# Patient Record
Sex: Female | Born: 1950
Health system: Southern US, Community
[De-identification: ages and names within clinical notes are randomized; demographics above are authoritative.]

## PROBLEM LIST (undated history)

## (undated) DIAGNOSIS — M810 Age-related osteoporosis without current pathological fracture: Secondary | ICD-10-CM

## (undated) DIAGNOSIS — N920 Excessive and frequent menstruation with regular cycle: Secondary | ICD-10-CM

## (undated) DIAGNOSIS — IMO0002 Reserved for concepts with insufficient information to code with codable children: Secondary | ICD-10-CM

## (undated) DIAGNOSIS — R87619 Unspecified abnormal cytological findings in specimens from cervix uteri: Secondary | ICD-10-CM

## (undated) DIAGNOSIS — K219 Gastro-esophageal reflux disease without esophagitis: Secondary | ICD-10-CM

## (undated) DIAGNOSIS — E785 Hyperlipidemia, unspecified: Secondary | ICD-10-CM

## (undated) DIAGNOSIS — N6459 Other signs and symptoms in breast: Secondary | ICD-10-CM

## (undated) DIAGNOSIS — C4491 Basal cell carcinoma of skin, unspecified: Secondary | ICD-10-CM

## (undated) DIAGNOSIS — Z8619 Personal history of other infectious and parasitic diseases: Secondary | ICD-10-CM

## (undated) DIAGNOSIS — I10 Essential (primary) hypertension: Secondary | ICD-10-CM

## (undated) HISTORY — DX: Basal cell carcinoma of skin, unspecified: C44.91

## (undated) HISTORY — DX: Hyperlipidemia, unspecified: E78.5

## (undated) HISTORY — DX: Gastro-esophageal reflux disease without esophagitis: K21.9

## (undated) HISTORY — DX: Excessive and frequent menstruation with regular cycle: N92.0

## (undated) HISTORY — DX: Unspecified abnormal cytological findings in specimens from cervix uteri: R87.619

## (undated) HISTORY — PX: BREAST BIOPSY: SHX20

## (undated) HISTORY — PX: CRYOTHERAPY: SHX1416

## (undated) HISTORY — DX: Reserved for concepts with insufficient information to code with codable children: IMO0002

## (undated) HISTORY — DX: Personal history of other infectious and parasitic diseases: Z86.19

## (undated) HISTORY — PX: DILATION AND CURETTAGE OF UTERUS: SHX78

## (undated) HISTORY — PX: TYMPANOSTOMY TUBE PLACEMENT: SHX32

## (undated) HISTORY — PX: BASAL CELL CARCINOMA EXCISION: SHX1214

---
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
## 1979-03-02 HISTORY — PX: TUBAL LIGATION: SHX77

## 2004-03-01 HISTORY — PX: FOOT SURGERY: SHX648

## 2007-12-05 ENCOUNTER — Other Ambulatory Visit: Admission: RE | Admit: 2007-12-05 | Discharge: 2007-12-05 | Payer: Self-pay | Admitting: Obstetrics and Gynecology

## 2009-03-01 HISTORY — PX: MOLE REMOVAL: SHX2046

## 2010-09-18 ENCOUNTER — Encounter: Payer: Self-pay | Admitting: Infectious Diseases

## 2010-11-09 ENCOUNTER — Telehealth: Payer: Self-pay | Admitting: *Deleted

## 2010-11-09 NOTE — Telephone Encounter (Signed)
error 

## 2011-03-03 NOTE — Progress Notes (Signed)
Patient seen for individual psychotherapy.  Patient upset because husband has no job, is going to two [employer] a day and Youth worker are calling her.  Recommended they engage in daily prayer together, patient get part time job at church and get car through [employer].  Next appointment 03-24-2011.

## 2011-04-09 ENCOUNTER — Other Ambulatory Visit: Payer: Self-pay | Admitting: Physician Assistant

## 2011-04-12 ENCOUNTER — Other Ambulatory Visit: Payer: Self-pay | Admitting: Physician Assistant

## 2011-04-14 ENCOUNTER — Encounter: Payer: Self-pay | Admitting: Psychiatry

## 2011-04-14 NOTE — Progress Notes (Signed)
04-14-2011  Patient seen for individual psychotherapy.  Session focused on strategies to help patient with school work, finances, and marital issues.  Next appointment 04-28-2011.

## 2011-04-28 ENCOUNTER — Encounter: Payer: Self-pay | Admitting: Psychiatry

## 2011-04-28 NOTE — Progress Notes (Signed)
04-28-2011  Patient called to cancel today's appointment.  WCB to reschedule.

## 2011-05-04 NOTE — Progress Notes (Signed)
This encounter was created in error - please disregard.

## 2012-02-15 ENCOUNTER — Other Ambulatory Visit: Payer: Self-pay | Admitting: *Deleted

## 2013-01-12 ENCOUNTER — Encounter: Payer: Self-pay | Admitting: Certified Nurse Midwife

## 2013-01-16 ENCOUNTER — Ambulatory Visit: Payer: Self-pay | Admitting: Certified Nurse Midwife

## 2013-01-16 ENCOUNTER — Encounter: Payer: Self-pay | Admitting: Certified Nurse Midwife

## 2013-01-16 ENCOUNTER — Ambulatory Visit (INDEPENDENT_AMBULATORY_CARE_PROVIDER_SITE_OTHER): Payer: BC Managed Care – PPO | Admitting: Certified Nurse Midwife

## 2013-01-16 VITALS — BP 106/64 | HR 68 | Resp 16 | Ht 62.75 in | Wt 121.0 lb

## 2013-01-16 DIAGNOSIS — Z01419 Encounter for gynecological examination (general) (routine) without abnormal findings: Secondary | ICD-10-CM

## 2013-01-16 DIAGNOSIS — Z0000 Encounter for general adult medical examination without abnormal findings: Secondary | ICD-10-CM

## 2013-01-16 LAB — COMPREHENSIVE METABOLIC PANEL
ALT: 16 U/L (ref 0–35)
AST: 22 U/L (ref 0–37)
Alkaline Phosphatase: 56 U/L (ref 39–117)
CO2: 30 mEq/L (ref 19–32)
Sodium: 142 mEq/L (ref 135–145)
Total Bilirubin: 0.6 mg/dL (ref 0.3–1.2)
Total Protein: 6.9 g/dL (ref 6.0–8.3)

## 2013-01-16 LAB — POCT URINALYSIS DIPSTICK
Bilirubin, UA: NEGATIVE
Blood, UA: NEGATIVE
Glucose, UA: NEGATIVE
Nitrite, UA: NEGATIVE
Urobilinogen, UA: NEGATIVE

## 2013-01-16 LAB — LIPID PANEL
Cholesterol: 232 mg/dL — ABNORMAL HIGH (ref 0–200)
Total CHOL/HDL Ratio: 2.6 Ratio
Triglycerides: 83 mg/dL (ref <150)
VLDL: 17 mg/dL (ref 0–40)

## 2013-01-16 LAB — HEMOGLOBIN, FINGERSTICK: Hemoglobin, fingerstick: 14.8 g/dL (ref 12.0–16.0)

## 2013-01-16 NOTE — Patient Instructions (Signed)

## 2013-01-16 NOTE — Progress Notes (Signed)
62 y.o. G4P1001 Married Caucasian Fe here for annual exam. Menopausal no HRT. Denies vaginal bleeding or dryness. Uses moisturizer as needed without problems. Still working on establish PCP care. Denies health issues today. Had colonoscopy!  Patient's last menstrual period was 03/01/1997.          Sexually active: yes  The current method of family planning is tubal ligation.    Exercising: yes  cardio,strength,walking & yoga Smoker:  no  Health Maintenance: Pap:  01-13-12 neg HPV HR neg MMG:  7/14 Colonoscopy:  3/14 one negative polyp 10 years BMD:   2011 TDaP:  2012 Labs: Poct urine-neg, Hgb-14.8 Self breast exam: done occ   reports that she has never smoked. She does not have any smokeless tobacco history on file. She reports that she drinks about 1.8 ounces of alcohol per week. She reports that she does not use illicit drugs.  Past Medical History  Diagnosis Date  . Abnormal Pap smear     as of 2014 it has been over 20 yrs ago   . Menorrhagia   . Cancer     basal cell of left leg    Past Surgical History  Procedure Laterality Date  . Cryotherapy      for abn pap. as of 2014 it has been over 20 yrs ago since done  . Tubal ligation  1981  . Dilation and curettage of uterus      for menorrhagia  . Foot surgery Left 2006    bone spur  . Basal cell carcinoma excision      left leg  . Tympanostomy tube placement    . Mole removal  2011    rt thigh, negative    Current Outpatient Prescriptions  Medication Sig Dispense Refill  . BIOTIN PO Take by mouth daily.      Marland Kitchen CALCIUM PO Take by mouth daily.      . Cetirizine-Pseudoephedrine (ZYRTEC-D PO) Take by mouth as needed.      . Multiple Vitamins-Minerals (MULTIVITAMIN PO) Take by mouth daily.      . Omega-3 Fatty Acids (OMEGA 3 PO) Take by mouth daily.      Marland Kitchen VITAMIN E PO Take by mouth as needed.        No current facility-administered medications for this visit.    Family History  Problem Relation Age of Onset  .  Hypertension Mother   . Heart disease Father   . Osteoporosis Sister   . Heart disease Sister     pacemaker  . Diabetes Sister   . Diabetes Brother   . Cancer Paternal Grandmother   . Cancer Brother     prostate    ROS:  Pertinent items are noted in HPI.  Otherwise, a comprehensive ROS was negative.  Exam:   BP 106/64  Pulse 68  Resp 16  Ht 5' 2.75" (1.594 m)  Wt 121 lb (54.885 kg)  BMI 21.60 kg/m2  LMP 03/01/1997 Height: 5' 2.75" (159.4 cm)  Ht Readings from Last 3 Encounters:  01/16/13 5' 2.75" (1.594 m)    General appearance: alert, cooperative and appears stated age Head: Normocephalic, without obvious abnormality, atraumatic Neck: no adenopathy, supple, symmetrical, trachea midline and thyroid normal to inspection and palpation Lungs: clear to auscultation bilaterally Breasts: normal appearance, no masses or tenderness, No nipple retraction or dimpling, No nipple discharge or bleeding, No axillary or supraclavicular adenopathy Heart: regular rate and rhythm Abdomen: soft, non-tender; no masses,  no organomegaly Extremities: extremities normal, atraumatic,  no cyanosis or edema Skin: Skin color, texture, turgor normal. No rashes or lesions Lymph nodes: Cervical, supraclavicular, and axillary nodes normal. No abnormal inguinal nodes palpated Neurologic: Grossly normal   Pelvic: External genitalia:  no lesions              Urethra:  normal appearing urethra with no masses, tenderness or lesions              Bartholin's and Skene's: normal                 Vagina: normal appearing vagina with normal color and discharge, no lesions              Cervix: normal, non tender              Pap taken: no Bimanual Exam:  Uterus:  normal size, contour, position, consistency, mobility, non-tender and anteverted              Adnexa: normal adnexa and no mass, fullness, tenderness               Rectovaginal: Confirms               Anus:  normal sphincter tone, no lesions  A:   Well Woman with normal exam  Menopausal no HRT  History of abnormal pap with cryo greater than 20 years ago( patient doesn't know date)  P:   Reviewed health and wellness pertinent to exam  Aware of need to advise if vaginal bleeding  Stressed aex  Fasting labs: CMP,lipid panel  Pap smear as per guidelines   Mammogram yearly pap smear not taken today  counseled on breast self exam, mammography screening, menopause, adequate intake of calcium and vitamin D, diet and exercise, Kegel's exercises, encouraged to work on establishing with PCP  return annually or prn  An After Visit Summary was printed and given to the patient.

## 2013-01-18 NOTE — Progress Notes (Signed)
Note reviewed, agree with plan.  Lakya Schrupp, MD  

## 2013-02-28 ENCOUNTER — Ambulatory Visit: Payer: Self-pay | Admitting: Certified Nurse Midwife

## 2013-12-31 ENCOUNTER — Encounter: Payer: Self-pay | Admitting: Certified Nurse Midwife

## 2014-01-30 ENCOUNTER — Ambulatory Visit (INDEPENDENT_AMBULATORY_CARE_PROVIDER_SITE_OTHER): Payer: No Typology Code available for payment source | Admitting: Certified Nurse Midwife

## 2014-01-30 ENCOUNTER — Encounter: Payer: Self-pay | Admitting: Certified Nurse Midwife

## 2014-01-30 VITALS — BP 110/70 | HR 68 | Resp 16 | Ht 63.0 in | Wt 124.0 lb

## 2014-01-30 DIAGNOSIS — Z01419 Encounter for gynecological examination (general) (routine) without abnormal findings: Secondary | ICD-10-CM

## 2014-01-30 DIAGNOSIS — Z124 Encounter for screening for malignant neoplasm of cervix: Secondary | ICD-10-CM

## 2014-01-30 DIAGNOSIS — Z0000 Encounter for general adult medical examination without abnormal findings: Secondary | ICD-10-CM

## 2014-01-30 LAB — POCT URINALYSIS DIPSTICK
Bilirubin, UA: NEGATIVE
GLUCOSE UA: NEGATIVE
Ketones, UA: NEGATIVE
Leukocytes, UA: NEGATIVE
Nitrite, UA: NEGATIVE
PH UA: 5
Protein, UA: NEGATIVE
RBC UA: NEGATIVE
UROBILINOGEN UA: NEGATIVE

## 2014-01-30 LAB — LIPID PANEL
CHOL/HDL RATIO: 2.4 ratio
Cholesterol: 218 mg/dL — ABNORMAL HIGH (ref 0–200)
HDL: 91 mg/dL (ref >39)
LDL Cholesterol: 116 mg/dL — ABNORMAL HIGH (ref 0–99)
Triglycerides: 55 mg/dL (ref <150)
VLDL: 11 mg/dL (ref 0–40)

## 2014-01-30 LAB — HEMOGLOBIN, FINGERSTICK: Hemoglobin, fingerstick: 15.2 g/dL (ref 12.0–16.0)

## 2014-01-30 NOTE — Patient Instructions (Signed)

## 2014-01-30 NOTE — Progress Notes (Signed)
63 y.o. G44P1001 Married Caucasian Fe here for annual exam. Menopausal no HRT. Denies vaginal bleeding or vaginal dryness.  Plans to establish with PCP in next year. No health issues this past year or today.  Patient's last menstrual period was 03/01/1997.          Sexually active: Yes.    The current method of family planning is tubal ligation.    Exercising: Yes.    walk & cardio Smoker:  no  Health Maintenance: Pap: 01-13-12 neg HPV HR neg MMG:  01-07-14 density category c, birads categrory 1:neg Colonoscopy:  3/14 one neg polyp f/u 5-74yrs BMD:   2011 TDaP:  2012 Labs: Poct urine-neg, Hgb-15.2 Self breast exam: done occ   reports that she has never smoked. She does not have any smokeless tobacco history on file. She reports that she drinks about 1.2 - 1.8 oz of alcohol per week. She reports that she does not use illicit drugs.  Past Medical History  Diagnosis Date  . Abnormal Pap smear     as of 2014 it has been over 20 yrs ago   . Menorrhagia   . Cancer     basal cell of left leg    Past Surgical History  Procedure Laterality Date  . Cryotherapy      for abn pap. as of 2014 it has been over 20 yrs ago since done  . Tubal ligation  1981  . Dilation and curettage of uterus      for menorrhagia  . Foot surgery Left 2006    bone spur  . Basal cell carcinoma excision      left leg  . Tympanostomy tube placement    . Mole removal  2011    rt thigh, negative    Current Outpatient Prescriptions  Medication Sig Dispense Refill  . BIOTIN PO Take by mouth daily.    Marland Kitchen CALCIUM PO Take by mouth daily.    . Cetirizine-Pseudoephedrine (ZYRTEC-D PO) Take by mouth as needed.    . Cholecalciferol (VITAMIN D PO) Take by mouth daily.    . Multiple Vitamins-Minerals (MULTIVITAMIN PO) Take by mouth daily.    . Omega-3 Fatty Acids (OMEGA 3 PO) Take by mouth daily.    Marland Kitchen VITAMIN E PO Take by mouth as needed.      No current facility-administered medications for this visit.    Family  History  Problem Relation Age of Onset  . Hypertension Mother   . Heart disease Father   . Osteoporosis Sister   . Heart disease Sister     pacemaker  . Diabetes Sister   . Diabetes Brother   . Cancer Paternal Grandmother   . Cancer Brother     prostate    ROS:  Pertinent items are noted in HPI.  Otherwise, a comprehensive ROS was negative.  Exam:   BP 110/70 mmHg  Pulse 68  Resp 16  Ht 5\' 3"  (1.6 m)  Wt 124 lb (56.246 kg)  BMI 21.97 kg/m2  LMP 03/01/1997 Height: 5\' 3"  (160 cm)  Ht Readings from Last 3 Encounters:  01/30/14 5\' 3"  (1.6 m)  01/16/13 5' 2.75" (1.594 m)    General appearance: alert, cooperative and appears stated age Head: Normocephalic, without obvious abnormality, atraumatic Neck: no adenopathy, supple, symmetrical, trachea midline and thyroid normal to inspection and palpation Lungs: clear to auscultation bilaterally Breasts: normal appearance, no masses or tenderness, No nipple retraction or dimpling, No nipple discharge or bleeding, No axillary or supraclavicular  adenopathy Heart: regular rate and rhythm Abdomen: soft, non-tender; no masses,  no organomegaly Extremities: extremities normal, atraumatic, no cyanosis or edema Skin: Skin color, texture, turgor normal. No rashes or lesions Lymph nodes: Cervical, supraclavicular, and axillary nodes normal. No abnormal inguinal nodes palpated Neurologic: Grossly normal   Pelvic: External genitalia:  no lesions              Urethra:  normal appearing urethra with no masses, tenderness or lesions              Bartholin's and Skene's: normal                 Vagina: normal appearing vagina with normal color and discharge, no lesions              Cervix: normal, no lesions or tenderness              Pap taken: Yes.   Bimanual Exam:  Uterus:  normal size, contour, position, consistency, mobility, non-tender and anteverted              Adnexa: normal adnexa and no mass, fullness, tenderness                Rectovaginal: Confirms               Anus:  normal sphincter tone, no lesions  A:  Well Woman with normal exam  Menopausal no HRT  History of abnormal pap greater than 20 years ago with cryo  P:   Reviewed health and wellness pertinent to exam  Aware of need to evaluate if vaginal bleeding  Pap smear taken today with reflex  Lab: Lipid panel   counseled on breast self exam, mammography screening, adequate intake of calcium and vitamin D, diet and exercise, Kegel's exercises  return annually or prn  An After Visit Summary was printed and given to the patient.

## 2014-02-04 LAB — IPS PAP TEST WITH REFLEX TO HPV

## 2014-02-06 NOTE — Progress Notes (Signed)
Reviewed personally.  M. Suzanne Parris Signer, MD.  

## 2015-02-10 ENCOUNTER — Ambulatory Visit: Payer: No Typology Code available for payment source | Admitting: Certified Nurse Midwife

## 2015-02-14 ENCOUNTER — Ambulatory Visit: Payer: No Typology Code available for payment source | Admitting: Certified Nurse Midwife

## 2015-04-03 ENCOUNTER — Ambulatory Visit: Payer: No Typology Code available for payment source | Admitting: Certified Nurse Midwife

## 2015-04-04 ENCOUNTER — Encounter: Payer: Self-pay | Admitting: Certified Nurse Midwife

## 2015-04-04 ENCOUNTER — Ambulatory Visit (INDEPENDENT_AMBULATORY_CARE_PROVIDER_SITE_OTHER): Payer: BLUE CROSS/BLUE SHIELD | Admitting: Certified Nurse Midwife

## 2015-04-04 VITALS — BP 120/70 | HR 72 | Resp 16 | Ht 62.75 in | Wt 107.0 lb

## 2015-04-04 DIAGNOSIS — Z01419 Encounter for gynecological examination (general) (routine) without abnormal findings: Secondary | ICD-10-CM

## 2015-04-04 DIAGNOSIS — Z0000 Encounter for general adult medical examination without abnormal findings: Secondary | ICD-10-CM | POA: Diagnosis not present

## 2015-04-04 DIAGNOSIS — Z124 Encounter for screening for malignant neoplasm of cervix: Secondary | ICD-10-CM | POA: Diagnosis not present

## 2015-04-04 LAB — POCT URINALYSIS DIPSTICK
Bilirubin, UA: NEGATIVE
Blood, UA: NEGATIVE
GLUCOSE UA: NEGATIVE
KETONES UA: NEGATIVE
Leukocytes, UA: NEGATIVE
Nitrite, UA: NEGATIVE
Protein, UA: NEGATIVE
Urobilinogen, UA: NEGATIVE
pH, UA: 5

## 2015-04-04 NOTE — Patient Instructions (Signed)
EXERCISE AND DIET:  We recommended that you start or continue a regular exercise program for good health. Regular exercise means any activity that makes your heart beat faster and makes you sweat.  We recommend exercising at least 30 minutes per day at least 3 days a week, preferably 4 or 5.  We also recommend a diet low in fat and sugar.  Inactivity, poor dietary choices and obesity can cause diabetes, heart attack, stroke, and kidney damage, among others.    ALCOHOL AND SMOKING:  Women should limit their alcohol intake to no more than 7 drinks/beers/glasses of wine (combined, not each!) per week. Moderation of alcohol intake to this level decreases your risk of breast cancer and liver damage. And of course, no recreational drugs are part of a healthy lifestyle.  And absolutely no smoking or even second hand smoke. Most people know smoking can cause heart and lung diseases, but did you know it also contributes to weakening of your bones? Aging of your skin?  Yellowing of your teeth and nails?  CALCIUM AND VITAMIN D:  Adequate intake of calcium and Vitamin D are recommended.  The recommendations for exact amounts of these supplements seem to change often, but generally speaking 600 mg of calcium (either carbonate or citrate) and 800 units of Vitamin D per day seems prudent. Certain women may benefit from higher intake of Vitamin D.  If you are among these women, your doctor will have told you during your visit.    PAP SMEARS:  Pap smears, to check for cervical cancer or precancers,  have traditionally been done yearly, although recent scientific advances have shown that most women can have pap smears less often.  However, every woman still should have a physical exam from her gynecologist every year. It will include a breast check, inspection of the vulva and vagina to check for abnormal growths or skin changes, a visual exam of the cervix, and then an exam to evaluate the size and shape of the uterus and  ovaries.  And after 64 years of age, a rectal exam is indicated to check for rectal cancers. We will also provide age appropriate advice regarding health maintenance, like when you should have certain vaccines, screening for sexually transmitted diseases, bone density testing, colonoscopy, mammograms, etc.   MAMMOGRAMS:  All women over 40 years old should have a yearly mammogram. Many facilities now offer a "3D" mammogram, which may cost around $50 extra out of pocket. If possible,  we recommend you accept the option to have the 3D mammogram performed.  It both reduces the number of women who will be called back for extra views which then turn out to be normal, and it is better than the routine mammogram at detecting truly abnormal areas.    COLONOSCOPY:  Colonoscopy to screen for colon cancer is recommended for all women at age 50.  We know, you hate the idea of the prep.  We agree, BUT, having colon cancer and not knowing it is worse!!  Colon cancer so often starts as a polyp that can be seen and removed at colonscopy, which can quite literally save your life!  And if your first colonoscopy is normal and you have no family history of colon cancer, most women don't have to have it again for 10 years.  Once every ten years, you can do something that may end up saving your life, right?  We will be happy to help you get it scheduled when you are ready.    Be sure to check your insurance coverage so you understand how much it will cost.  It may be covered as a preventative service at no cost, but you should check your particular policy.     Atrophic Vaginitis Atrophic vaginitis is when the tissues that line the vagina become dry and thin. This is caused by a drop in estrogen. Estrogen helps:  To keep the vagina moist.  To make a clear fluid that helps:  To lubricate the vagina for sex.  To protect the vagina from infection. If the lining of the vagina is dry and thin, it may:  Make sex painful. It may  also cause bleeding.  Cause a feeling of:  Burning.  Irritation.  Itchiness.  Make an exam of your vagina painful. It may also cause bleeding.  Make you lose interest in sex.  Cause a burning feeling when you pee.  Make your vaginal fluid (discharge) brown or yellow. For some women, there are no symptoms. This condition is most common in women who do not get their regular menstrual periods anymore (menopause). This often starts when a woman is 56-31 years old. HOME CARE  Take medicines only as told by your doctor. Do not use any herbal or alternative medicines unless your doctor says it is okay.  Use over-the-counter products for dryness only as told by your doctor. These include:  Creams.  Lubricants.  Moisturizers.  Do not douche.  Do not use products that can make your vagina dry. These include:  Scented feminine sprays.  Scented tampons.  Scented soaps.  If it hurts to have sex, tell your sexual partner. GET HELP IF:  Your discharge looks different than normal.  Your vagina has an unusual smell.  You have new symptoms.  Your symptoms do not get better with treatment.  Your symptoms get worse.  Coconut oil nightly!!! Debbi   This information is not intended to replace advice given to you by your health care provider. Make sure you discuss any questions you have with your health care provider.   Document Released: 08/04/2007 Document Revised: 07/02/2014 Document Reviewed: 02/06/2014 Elsevier Interactive Patient Education [employer].

## 2015-04-04 NOTE — Progress Notes (Signed)
65 y.o. G45P1001 Married  Caucasian Fe here for annual exam. Menopausal no HRT. Denies vaginal dryness or vaginal bleeding. Has not established PCP yet, but has someone in mine. Uses urgent care if needed. Stressful year with spouse with heart issues and mother in law now living with them. Has lost 17 pounds with the changes she that are occurring. Eating but not as much. Does have good support system with family. Desires screening labs today. No other health issues today.   Patient's last menstrual period was 03/01/1997.          Sexually active: Yes.    The current method of family planning is tubal ligation.    Exercising: Yes.    cardio, strength training & cardio Smoker:  no  Health Maintenance: Pap:  01-30-14 neg, no endos due to atrophy, cryo over 31yrs ago MMG:  01-07-14 category c birads 1:neg  Colonoscopy:  3/14 neg polyp f/u 5-22yrs BMD:   2011 TDaP:  2012 Shingles: no Pneumonia: no Hep C and HIV: not done Labs: poct urine-neg, Hgb-14.6 Self breast exam: done occ Flu shot-done   reports that she has never smoked. She does not have any smokeless tobacco history on file. She reports that she drinks about 1.8 - 2.4 oz of alcohol per week. She reports that she does not use illicit drugs.  Past Medical History  Diagnosis Date  . Abnormal Pap smear     as of 2014 it has been over 20 yrs ago   . Menorrhagia   . Cancer ([place])     basal cell of left leg    Past Surgical History  Procedure Laterality Date  . Cryotherapy      for abn pap. as of 2014 it has been over 20 yrs ago since done  . Tubal ligation  1981  . Dilation and curettage of uterus      for menorrhagia  . Foot surgery Left 2006    bone spur  . Basal cell carcinoma excision      left leg  . Tympanostomy tube placement    . Mole removal  2011    rt thigh, negative    Current Outpatient Prescriptions  Medication Sig Dispense Refill  . BIOTIN PO Take by mouth daily.    Marland Kitchen CALCIUM PO Take by mouth daily.     . Cetirizine-Pseudoephedrine (ZYRTEC-D PO) Take by mouth as needed.    . Cholecalciferol (VITAMIN D PO) Take by mouth daily.    . Multiple Vitamins-Minerals (MULTIVITAMIN PO) Take by mouth daily.    . Omega-3 Fatty Acids (OMEGA 3 PO) Take by mouth daily.    Marland Kitchen VITAMIN E PO Take by mouth as needed.      No current facility-administered medications for this visit.    Family History  Problem Relation Age of Onset  . Hypertension Mother   . Heart disease Father   . Osteoporosis Sister   . Heart disease Sister     pacemaker  . Diabetes Sister   . Diabetes Brother   . Cancer Paternal Grandmother   . Cancer Brother     prostate    ROS:  Pertinent items are noted in HPI.  Otherwise, a comprehensive ROS was negative.  Exam:   BP 120/70 mmHg  Pulse 72  Resp 16  Ht 5' 2.75" (1.594 m)  Wt 107 lb (48.535 kg)  BMI 19.10 kg/m2  LMP 03/01/1997 Height: 5' 2.75" (159.4 cm) Ht Readings from Last 3 Encounters:  04/04/15 5'  2.75" (1.594 m)  01/30/14 5\' 3"  (1.6 m)  01/16/13 5' 2.75" (1.594 m)    General appearance: alert, cooperative and appears stated age Head: Normocephalic, without obvious abnormality, atraumatic Neck: no adenopathy, supple, symmetrical, trachea midline and thyroid normal to inspection and palpation Lungs: clear to auscultation bilaterally Breasts: normal appearance, no masses or tenderness, No nipple retraction or dimpling, No nipple discharge or bleeding, No axillary or supraclavicular adenopathy Heart: regular rate and rhythm Abdomen: soft, non-tender; no masses,  no organomegaly Extremities: extremities normal, atraumatic, no cyanosis or edema Skin: Skin color, texture, turgor normal. No rashes or lesions Lymph nodes: Cervical, supraclavicular, and axillary nodes normal. No abnormal inguinal nodes palpated Neurologic: Grossly normal   Pelvic: External genitalia:  no lesions              Urethra:  normal appearing urethra with no masses, tenderness or lesions               Bartholin's and Skene's: normal                 Vagina: normal appearing vagina with normal color and discharge, no lesions              Cervix: normal, no lesions or tenderness              Pap taken: Yes.   Bimanual Exam:  Uterus:  normal size, contour, position, consistency, mobility, non-tender              Adnexa: normal adnexa and no mass, fullness, tenderness               Rectovaginal: Confirms               Anus:  normal sphincter tone, no lesions  Chaperone present: yes   A:  Well Woman with normal exam  Menopausal no HRT  Atrophic vaginitis  Social stress with weight loss  Mammogram and BMD due  Screening labs  P:   Reviewed health and wellness pertinent to exam  Aware of need to advise if vaginal bleeding  Discussed coconut oil use again for dryness, has used but has been too busy recently. Will start again twice weekly and will advise if problems.  Encouraged to drink one Essure daily to increase calorie load. Patient will try and will advise if no change. Seek friend and family support as needed, in addition to taking time for self.  Stressed importance of mammogram and SBE. Patient declines assistance with our scheduling, but will schedule.  Discussed BMD due and can schedule with mammogram. Plans to do this. Will call if needs referral.  Labs: CMP,Hep C, HIV, Lipid panel,TSH,Vitamin D  Pap smear as above with HPVHR   counseled on breast self exam, mammography screening, HIV risk factors and prevention, adequate intake of calcium and vitamin D, diet and exercise  return annually or prn  An After Visit Summary was printed and given to the patient.

## 2015-04-05 LAB — COMPREHENSIVE METABOLIC PANEL
ALBUMIN: 4.3 g/dL (ref 3.6–5.1)
ALT: 20 U/L (ref 6–29)
AST: 25 U/L (ref 10–35)
Alkaline Phosphatase: 53 U/L (ref 33–130)
BILIRUBIN TOTAL: 0.6 mg/dL (ref 0.2–1.2)
BUN: 15 mg/dL (ref 7–25)
CO2: 22 mmol/L (ref 20–31)
CREATININE: 0.85 mg/dL (ref 0.50–0.99)
Calcium: 9.6 mg/dL (ref 8.6–10.4)
Chloride: 103 mmol/L (ref 98–110)
Glucose, Bld: 89 mg/dL (ref 65–99)
Potassium: 4.1 mmol/L (ref 3.5–5.3)
SODIUM: 144 mmol/L (ref 135–146)
TOTAL PROTEIN: 6.8 g/dL (ref 6.1–8.1)

## 2015-04-05 LAB — LIPID PANEL
Cholesterol: 245 mg/dL — ABNORMAL HIGH (ref 125–200)
HDL: 106 mg/dL (ref >=46)
LDL Cholesterol: 126 mg/dL (ref <130)
TRIGLYCERIDES: 64 mg/dL (ref <150)
Total CHOL/HDL Ratio: 2.3 Ratio (ref <=5.0)
VLDL: 13 mg/dL (ref <30)

## 2015-04-05 LAB — VITAMIN D 25 HYDROXY (VIT D DEFICIENCY, FRACTURES): Vit D, 25-Hydroxy: 41 ng/mL (ref 30–100)

## 2015-04-05 LAB — TSH: TSH: 1.028 u[IU]/mL (ref 0.350–4.500)

## 2015-04-05 LAB — HIV ANTIBODY (ROUTINE TESTING W REFLEX): HIV: NONREACTIVE

## 2015-04-05 LAB — HEPATITIS C ANTIBODY: HCV AB: NEGATIVE

## 2015-04-06 NOTE — Progress Notes (Signed)
Reviewed personally.  M. Suzanne Travonna Swindle, MD.  

## 2015-04-07 LAB — HEMOGLOBIN, FINGERSTICK: HEMOGLOBIN, FINGERSTICK: 14.6 g/dL (ref 12.0–16.0)

## 2015-04-08 ENCOUNTER — Other Ambulatory Visit: Payer: Self-pay | Admitting: Certified Nurse Midwife

## 2015-04-08 DIAGNOSIS — R899 Unspecified abnormal finding in specimens from other organs, systems and tissues: Secondary | ICD-10-CM

## 2015-04-09 LAB — IPS PAP TEST WITH HPV

## 2015-10-06 ENCOUNTER — Other Ambulatory Visit (INDEPENDENT_AMBULATORY_CARE_PROVIDER_SITE_OTHER): Payer: BLUE CROSS/BLUE SHIELD

## 2015-10-06 DIAGNOSIS — R899 Unspecified abnormal finding in specimens from other organs, systems and tissues: Secondary | ICD-10-CM

## 2015-10-06 LAB — LIPID PANEL
CHOLESTEROL: 219 mg/dL — AB (ref 125–200)
HDL: 94 mg/dL (ref >=46)
LDL Cholesterol: 108 mg/dL (ref <130)
TRIGLYCERIDES: 83 mg/dL (ref <150)
Total CHOL/HDL Ratio: 2.3 Ratio (ref <=5.0)
VLDL: 17 mg/dL (ref <30)

## 2016-03-08 DIAGNOSIS — Z1231 Encounter for screening mammogram for malignant neoplasm of breast: Secondary | ICD-10-CM | POA: Diagnosis not present

## 2016-03-11 ENCOUNTER — Encounter: Payer: Self-pay | Admitting: Certified Nurse Midwife

## 2016-04-01 DIAGNOSIS — M9902 Segmental and somatic dysfunction of thoracic region: Secondary | ICD-10-CM | POA: Diagnosis not present

## 2016-04-01 DIAGNOSIS — M9903 Segmental and somatic dysfunction of lumbar region: Secondary | ICD-10-CM | POA: Diagnosis not present

## 2016-04-01 DIAGNOSIS — M9901 Segmental and somatic dysfunction of cervical region: Secondary | ICD-10-CM | POA: Diagnosis not present

## 2016-04-05 ENCOUNTER — Encounter (HOSPITAL_BASED_OUTPATIENT_CLINIC_OR_DEPARTMENT_OTHER): Payer: Self-pay | Admitting: Emergency Medicine

## 2016-04-05 ENCOUNTER — Emergency Department (HOSPITAL_BASED_OUTPATIENT_CLINIC_OR_DEPARTMENT_OTHER)
Admission: EM | Admit: 2016-04-05 | Discharge: 2016-04-05 | Disposition: A | Discharge status: Home / Self Care | Payer: Medicare HMO | Attending: Emergency Medicine | Admitting: Emergency Medicine

## 2016-04-05 ENCOUNTER — Emergency Department (HOSPITAL_BASED_OUTPATIENT_CLINIC_OR_DEPARTMENT_OTHER): Payer: Medicare HMO

## 2016-04-05 DIAGNOSIS — R0789 Other chest pain: Secondary | ICD-10-CM | POA: Diagnosis not present

## 2016-04-05 DIAGNOSIS — R200 Anesthesia of skin: Secondary | ICD-10-CM | POA: Diagnosis not present

## 2016-04-05 DIAGNOSIS — R42 Dizziness and giddiness: Secondary | ICD-10-CM | POA: Diagnosis present

## 2016-04-05 DIAGNOSIS — Z79899 Other long term (current) drug therapy: Secondary | ICD-10-CM | POA: Diagnosis not present

## 2016-04-05 DIAGNOSIS — M79602 Pain in left arm: Secondary | ICD-10-CM | POA: Diagnosis not present

## 2016-04-05 DIAGNOSIS — R079 Chest pain, unspecified: Secondary | ICD-10-CM | POA: Diagnosis not present

## 2016-04-05 LAB — CBC WITH DIFFERENTIAL/PLATELET
Basophils Absolute: 0 10*3/uL (ref 0.0–0.1)
Basophils Relative: 0 %
Eosinophils Absolute: 0.1 10*3/uL (ref 0.0–0.7)
Eosinophils Relative: 1 %
HCT: 44.3 % (ref 36.0–46.0)
Hemoglobin: 15 g/dL (ref 12.0–15.0)
Lymphocytes Relative: 17 %
Lymphs Abs: 0.9 10*3/uL (ref 0.7–4.0)
MCH: 31 pg (ref 26.0–34.0)
MCHC: 33.9 g/dL (ref 30.0–36.0)
MCV: 91.5 fL (ref 78.0–100.0)
Monocytes Absolute: 0.4 10*3/uL (ref 0.1–1.0)
Monocytes Relative: 8 %
Neutro Abs: 3.6 10*3/uL (ref 1.7–7.7)
Neutrophils Relative %: 74 %
Platelets: 195 10*3/uL (ref 150–400)
RBC: 4.84 MIL/uL (ref 3.87–5.11)
RDW: 13.5 % (ref 11.5–15.5)
WBC: 5 10*3/uL (ref 4.0–10.5)

## 2016-04-05 LAB — COMPREHENSIVE METABOLIC PANEL
ALT: 23 U/L (ref 14–54)
ANION GAP: 8 (ref 5–15)
AST: 26 U/L (ref 15–41)
Albumin: 4.6 g/dL (ref 3.5–5.0)
Alkaline Phosphatase: 57 U/L (ref 38–126)
BILIRUBIN TOTAL: 0.7 mg/dL (ref 0.3–1.2)
BUN: 17 mg/dL (ref 6–20)
CO2: 26 mmol/L (ref 22–32)
Calcium: 9.2 mg/dL (ref 8.9–10.3)
Chloride: 108 mmol/L (ref 101–111)
Creatinine, Ser: 0.9 mg/dL (ref 0.44–1.00)
Glucose, Bld: 102 mg/dL — ABNORMAL HIGH (ref 65–99)
POTASSIUM: 3.7 mmol/L (ref 3.5–5.1)
Sodium: 142 mmol/L (ref 135–145)
Total Protein: 7.1 g/dL (ref 6.5–8.1)

## 2016-04-05 LAB — TROPONIN I
Troponin I: <0.03 ng/mL (ref <0.03)
Troponin I: <0.03 ng/mL (ref <0.03)

## 2016-04-05 NOTE — ED Notes (Signed)
ED Provider at bedside. 

## 2016-04-05 NOTE — ED Triage Notes (Addendum)
Pt c/o LUE numbess, intermittently since 2am; similar episode approx 1 wk ago; also c/o "head doesn't feel right" today; also c/o LT chest discomfort since approx 2am as well

## 2016-04-05 NOTE — ED Provider Notes (Signed)
[facility] DEPT Provider Note   CSN: [id number] Arrival date & time: 04/05/16 [id number]     History    Chief Complaint  Patient presents with  . Dizziness     HPI Jacqueline Ray is a 66 y.o. female.  65yo F who p/w L arm numbness, heart fluttering and chest discomfort. Patient was awakened at 2 AM this morning with sudden onset of left arm numbness associated with heart racing sensation and head fluttering sensation. She got up and did some things around the house and then laid back down and symptoms resolved and she eventually fell asleep again. She woke back up at 4:30 AM with the same symptoms as well as mild, dull left chest discomfort. His discomfort has been almost constant since it began. She has continued to have the intermittent episodes of left arm numbness with heart racing sensation that last a few minutes and then resolved for at least 30 minutes before reoccurring again. She reports associated occasional lightheadedness but denies any shortness of breath, nausea, vomiting, or diaphoresis. She denies any headache, extremity weakness, visual changes, fevers, cough/cold symptoms, or recent illness. She states that she did have a similar episode of left arm numbness and heart fluttering last week that resolved spontaneously. She denies any association with exertion, movement, or positioning.  Daily history notable for father with MI in his 37s, brother with MI in his 46s, and sister with MI in her 53s.   Past Medical History:  Diagnosis Date  . Abnormal Pap smear    as of 2014 it has been over 20 yrs ago   . Cancer ([place])    basal cell of left leg  . Menorrhagia      There are no active problems to display for this patient.   Past Surgical History:  Procedure Laterality Date  . BASAL CELL CARCINOMA EXCISION     left leg  . CRYOTHERAPY     for abn pap. as of 2014 it has been over 20 yrs ago since done  . DILATION AND CURETTAGE OF UTERUS     for menorrhagia  . FOOT  SURGERY Left 2006   bone spur  . MOLE REMOVAL  2011   rt thigh, negative  . TUBAL LIGATION  1981  . TYMPANOSTOMY TUBE PLACEMENT      OB History    Gravida Para Term Preterm AB Living   1 1 1     1    SAB TAB Ectopic Multiple Live Births           1        Home Medications    Prior to Admission medications   Medication Sig Start Date End Date Taking? Authorizing Provider  BIOTIN PO Take by mouth daily.    Historical Provider, MD  CALCIUM PO Take by mouth daily.    Historical Provider, MD  Cetirizine-Pseudoephedrine (ZYRTEC-D PO) Take by mouth as needed.    Historical Provider, MD  Cholecalciferol (VITAMIN D PO) Take by mouth daily.    Historical Provider, MD  Multiple Vitamins-Minerals (MULTIVITAMIN PO) Take by mouth daily.    Historical Provider, MD  Omega-3 Fatty Acids (OMEGA 3 PO) Take by mouth daily.    Historical Provider, MD  VITAMIN E PO Take by mouth as needed.     Historical Provider, MD      Family History  Problem Relation Age of Onset  . Osteoporosis Sister   . Heart disease Sister     pacemaker  . Diabetes  Sister   . Diabetes Brother   . Cancer Brother     prostate  . Hypertension Mother   . Heart disease Father   . Cancer Paternal Grandmother      Social History  Substance Use Topics  . Smoking status: Never Smoker  . Smokeless tobacco: Never Used  . Alcohol use 1.8 - 2.4 oz/week    3 - 4 Glasses of wine per week     Comment: per week     Allergies     Codeine and Darvocet [propoxyphene n-acetaminophen]    Review of Systems  10 Systems reviewed and are negative for acute change except as noted in the HPI.   Physical Exam Updated Vital Signs BP 139/68   Pulse 72   Temp 98.4 F (36.9 C) (Oral)   Resp 17   Ht 5' 3.5" (1.613 m)   Wt 115 lb (52.2 kg)   LMP 03/01/1997   SpO2 100%   BMI 20.05 kg/m   Physical Exam  Constitutional: She is oriented to person, place, and time. She appears well-developed and well-nourished. No  distress.  Awake, alert  HENT:  Head: Normocephalic and atraumatic.  Eyes: Conjunctivae and EOM are normal. Pupils are equal, round, and reactive to light.  Neck: Neck supple.  Cardiovascular: Normal rate, regular rhythm and normal heart sounds.   No murmur heard. Pulmonary/Chest: Effort normal and breath sounds normal. No respiratory distress.  Abdominal: Soft. Bowel sounds are normal. She exhibits no distension. There is no tenderness.  Musculoskeletal: She exhibits no edema.  Neurological: She is alert and oriented to person, place, and time. She has normal reflexes. No cranial nerve deficit. She exhibits normal muscle tone.  Fluent speech, normal finger-to-nose testing, negative pronator drift, no clonus 5/5 strength and normal sensation x all 4 extremities  Skin: Skin is warm and dry.  Psychiatric: She has a normal mood and affect. Judgment and thought content normal.  Nursing note and vitals reviewed.     ED Treatments / Results  Labs (all labs ordered are listed, but only abnormal results are displayed) Labs Reviewed  COMPREHENSIVE METABOLIC PANEL - Abnormal; Notable for the following:       Result Value   Glucose, Bld 102 (*)    All other components within normal limits  CBC WITH DIFFERENTIAL/PLATELET  TROPONIN I  TROPONIN I     EKG  EKG Interpretation  Date/Time:  Monday April 05 2016 09:15:10 EST Ventricular Rate:  84 PR Interval:    QRS Duration: 103 QT Interval:  357 QTC Calculation: 422 R Axis:   44 Text Interpretation:  Sinus rhythm Biatrial enlargement RSR' in V1 or V2, right VCD or RVH No previous ECGs available Confirmed by LITTLE MD, RACHEL [phone]) on 04/05/2016 9:18:31 AM Also confirmed by LITTLE MD, [place] [phone]), editor [place]  CCT, BEVERLY ([id number])  on 04/05/2016 11:25:24 AM         Radiology Dg Chest 2 View  Result Date: 04/05/2016 CLINICAL DATA:  Left-sided chest pain radiating to the arm beginning several hours ago. EXAM: CHEST  2 VIEW  COMPARISON:  None. FINDINGS: Heart size is normal. Mediastinal shadows are normal. The lungs are clear. No bronchial thickening. No infiltrate, mass, effusion or collapse. Pulmonary vascularity is normal. No bony abnormality. IMPRESSION: No active cardiopulmonary disease. Electronically Signed   By: Nelson Chimes M.D.   On: 04/05/2016 11:01    Procedures Procedures (including critical care time) Procedures  Medications Ordered in ED  Medications - No  data to display   Initial Impression / Assessment and Plan / ED Course  I have reviewed the triage vital signs and the nursing notes.  Pertinent labs & imaging results that were available during my care of the patient were reviewed by me and considered in my medical decision making (see chart for details).    Pt w/ intermittent episodes of left arm numbness associated with heart fluttering and had fluttering, no associated weakness or confusion. She has had intermittent mild left chest discomfort but no severe chest pain and no shortness of breath. She was well-appearing on exam, vital signs notable for hypertension at 172/85. Heart rate during my examination was normal. She had a normal neurologic exam. EKG without acute ischemia. Obtained above lab work including troponin.  I consider neurologic process such as TIA or stroke but the patient has a normal neurologic exam and given report of heart flutter and discomfort related to it I feel neurologic process is extremely unlikely.  Serial troponins negative. No risk factors for PE and given no SOB and normal VS I feel PE is extremely unlikely. Given that patient has no medical problems, her HEART score is 3 based on age and Family history of heart disease. I have discussed results with her and discussed options of going home with follow-up versus being admitted to the hospital for chest pain rule out. The patient feels comfortable with discharge and will contact PCP for outpatient appointment.  Extensively reviewed return precautions with her including any worsening symptoms, severe chest pain, or exertional symptoms. She voiced understanding and was discharged in satisfactory condition.  Final Clinical Impressions(s) / ED Diagnoses   Final diagnoses:  Left arm numbness  Chest discomfort     New Prescriptions   No medications on file       Sharlett Iles, MD 04/05/16 [phone]

## 2016-04-05 NOTE — Discharge Instructions (Signed)
PLEASE CONTACT PRIMARY CARE PROVIDER FOR NEXT AVAILABLE APPOINTMENT. YOU MAY NEED STRESS TEST OF YOUR HEART. RETURN IMMEDIATELY IF ANY OF YOUR SYMPTOMS WORSEN.

## 2016-04-07 ENCOUNTER — Ambulatory Visit: Payer: BLUE CROSS/BLUE SHIELD | Admitting: Certified Nurse Midwife

## 2016-04-08 ENCOUNTER — Ambulatory Visit (INDEPENDENT_AMBULATORY_CARE_PROVIDER_SITE_OTHER): Payer: Medicare HMO | Admitting: Medical

## 2016-04-08 ENCOUNTER — Encounter: Payer: Self-pay | Admitting: Medical

## 2016-04-08 VITALS — BP 140/81 | HR 82 | Temp 98.0°F | Ht 62.75 in | Wt 118.0 lb

## 2016-04-08 DIAGNOSIS — R0789 Other chest pain: Secondary | ICD-10-CM

## 2016-04-08 DIAGNOSIS — H938X1 Other specified disorders of right ear: Secondary | ICD-10-CM | POA: Diagnosis not present

## 2016-04-08 DIAGNOSIS — R030 Elevated blood-pressure reading, without diagnosis of hypertension: Secondary | ICD-10-CM

## 2016-04-08 MED ORDER — FLUTICASONE PROPIONATE 50 MCG/ACT NA SUSP: 2.0000 | Freq: Every day | NASAL | 3 refills | Status: DC

## 2016-04-08 NOTE — Progress Notes (Signed)
Subjective:    Patient ID: Jacqueline Ray, female    DOB: Jul 03, 1950, 66 y.o.   MRN: [id number]  HPI   I have reviewed pt PMH, PSH, FH, Social History and Surgical History  Pt works Surveyor, quantity, exercise regularly at least 3-4 times a week, Eat moderate healthy, married- 1 son.  Pt in states she went to ED other night for symptoms of left arm numbness and fluttering sensation in her chest(those symptoms lasted for about 12 hrs and one other episode about 1 week before went to ED). Pt had work up of serial troponins and were negative. Pt had negative ekg in ED. Pt was educated/offered possible admission to rule out cardiac condition but she declined. Pt states by Tuesday symptoms resolved and did not return. Dad MI at age 78 yo. 1 brother MI- pt was 26 yo. Pt does have high total cholesterol 1 yr ago in epic. Pt does not smoke. Pt has no hx of htn.  Pt has some rt side ear pressure and faint nasal congestion. Hx of ear tube placed in 2009 after not able to hear.(Done by Dr. Thornell Mule). Pt has off and on nasal congestion since the fall.    Review of Systems  Constitutional: Negative for chills.  HENT: Positive for congestion and ear pain. Negative for postnasal drip, rhinorrhea, sinus pain, sinus pressure and tinnitus.   Respiratory: Negative for cough, chest tightness, shortness of breath and wheezing.   Cardiovascular: Negative for chest pain and palpitations.       None presently.  Gastrointestinal: Negative for abdominal pain.  Musculoskeletal: Negative for back pain, gait problem and neck pain.  Skin: Negative for rash.  Neurological: Negative for dizziness, tremors, facial asymmetry and numbness.  Hematological: Negative for adenopathy. Does not bruise/bleed easily.  Psychiatric/Behavioral: Negative for behavioral problems, confusion and suicidal ideas. The patient is not nervous/anxious.     Past Medical History:  Diagnosis Date  . Abnormal Pap smear    as of 2014 it has been  over 20 yrs ago   . Hyperlipidemia   . Menorrhagia      Social History   Social History  . Marital status: Married    Spouse name: N/A  . Number of children: N/A  . Years of education: N/A   Occupational History  . Not on file.   Social History Main Topics  . Smoking status: Never Smoker  . Smokeless tobacco: Never Used  . Alcohol use 1.8 - 2.4 oz/week    3 - 4 Glasses of wine per week     Comment: per week  . Drug use: No  . Sexual activity: Yes    Partners: Male    Birth control/ protection: Surgical     Comment: BTL   Other Topics Concern  . Not on file   Social History Narrative  . No narrative on file    Past Surgical History:  Procedure Laterality Date  . BASAL CELL CARCINOMA EXCISION    . CRYOTHERAPY     for abn pap. as of 2014 it has been over 20 yrs ago since done  . DILATION AND CURETTAGE OF UTERUS     for menorrhagia  . FOOT SURGERY Left 2006   bone spur  . MOLE REMOVAL  2011   rt thigh, negative  . TUBAL LIGATION  1981  . TYMPANOSTOMY TUBE PLACEMENT      Family History  Problem Relation Age of Onset  . Osteoporosis Sister   . Heart  disease Sister     pacemaker  . Diabetes Sister   . Diabetes Brother   . Cancer Brother     prostate  . Hypertension Mother   . Heart disease Father   . Cancer Paternal Grandmother     Allergies  Allergen Reactions  . Codeine   . Darvocet [Propoxyphene N-Acetaminophen] Nausea And Vomiting    Current Outpatient Prescriptions on File Prior to Visit  Medication Sig Dispense Refill  . BIOTIN PO Take by mouth daily.    Marland Kitchen CALCIUM PO Take by mouth daily.    . Cholecalciferol (VITAMIN D PO) Take by mouth daily.    . Multiple Vitamins-Minerals (MULTIVITAMIN PO) Take by mouth daily.    . Omega-3 Fatty Acids (OMEGA 3 PO) Take by mouth daily.    Marland Kitchen VITAMIN E PO Take by mouth as needed.     . Cetirizine-Pseudoephedrine (ZYRTEC-D PO) Take by mouth as needed.     No current facility-administered medications on  file prior to visit.     BP 140/81   Pulse 82   Temp 98 F (36.7 C) (Oral)   Ht 5' 2.75" (1.594 m)   Wt 118 lb (53.5 kg)   LMP 03/01/1997   SpO2 98%   BMI 21.07 kg/m       Objective:   Physical Exam  General Mental Status- Alert. General Appearance- Not in acute distress.   Heent- negative but rt ear canal tube in place with some faint wax at base of tm adjacent to ear tube. Tm normal color. Also mild boggy turbinates.  Skin General: Color- Normal Color. Moisture- Normal Moisture.  Neck Carotid Arteries- Normal color. Moisture- Normal Moisture. No carotid bruits. No JVD.  Chest and Lung Exam Auscultation: Breath Sounds:-Normal.  Cardiovascular Auscultation:Rythm- Regular. Murmurs & Other Heart Sounds:Auscultation of the heart reveals- No Murmurs.  Abdomen Inspection:-Inspeection Normal. Palpation/Percussion:Note:No mass. Palpation and Percussion of the abdomen reveal- Non Tender, Non Distended + BS, no rebound or guarding.  Neurologic Cranial Nerve exam:- CN III-XII intact(No nystagmus), symmetric smile. Strength:- 5/5 equal and symmetric strength both upper and lower extremities.      Assessment & Plan:  For your recent atypical chest pain and moderate risk factors refer to cardiologist for  Stress test and possible holter monitor.  Future lipid panel and cmp to be done next week.  Avoid caffeine.  If recurrent chest pain, flutters or associated symptoms then ED evaluation.  May consider low dose statin in future. (future labs to be done in one week fasting) Check bp daily with husband bp cuff and record. [id number] would be acceptable. Notify us in one week on readings.  For ear pressure rx flonase. If with this and pressure persists then ENT referral.  Follow up in 3-4 weeks or as needed  Almee Pelphrey, Percell Miller, [employer]

## 2016-04-08 NOTE — Progress Notes (Signed)
Pre visit review using our clinic tool,if applicable. No additional management support is needed unless otherwise documented below in the visit note.  

## 2016-04-08 NOTE — Patient Instructions (Addendum)
For your recent atypical chest pain and moderate risk factors refer to cardiologist for stress test and possible holter monitor.  Future lipid panel and cmp to be done next week.  Avoid caffeine.  If recurrent chest pain, flutters or associated symptoms then ED evaluation.  May consider low dose statin in future. (future labs to be done in one week fasting) Check bp daily with husband bp cuff and record. [id number] would be acceptable. Notify us in one week on readings.  For ear pressure rx flonase. If with this and pressure persists then ENT referral.  Follow up in 3-4 weeks or as needed

## 2016-04-15 ENCOUNTER — Other Ambulatory Visit (INDEPENDENT_AMBULATORY_CARE_PROVIDER_SITE_OTHER): Payer: Medicare HMO

## 2016-04-15 DIAGNOSIS — R0789 Other chest pain: Secondary | ICD-10-CM | POA: Diagnosis not present

## 2016-04-15 DIAGNOSIS — R030 Elevated blood-pressure reading, without diagnosis of hypertension: Secondary | ICD-10-CM | POA: Diagnosis not present

## 2016-04-15 LAB — COMPREHENSIVE METABOLIC PANEL
ALBUMIN: 4.4 g/dL (ref 3.5–5.2)
ALK PHOS: 57 U/L (ref 39–117)
ALT: 20 U/L (ref 0–35)
AST: 24 U/L (ref 0–37)
BILIRUBIN TOTAL: 0.5 mg/dL (ref 0.2–1.2)
BUN: 17 mg/dL (ref 6–23)
CALCIUM: 9.3 mg/dL (ref 8.4–10.5)
CO2: 32 mEq/L (ref 19–32)
CREATININE: 0.9 mg/dL (ref 0.40–1.20)
Chloride: 104 mEq/L (ref 96–112)
GFR: 66.76 mL/min (ref >60.00)
Glucose, Bld: 91 mg/dL (ref 70–99)
Potassium: 3.7 mEq/L (ref 3.5–5.1)
Sodium: 142 mEq/L (ref 135–145)
TOTAL PROTEIN: 6.9 g/dL (ref 6.0–8.3)

## 2016-04-15 LAB — LIPID PANEL
CHOL/HDL RATIO: 3
CHOLESTEROL: 246 mg/dL — AB (ref 0–200)
HDL: 95.7 mg/dL (ref >39.00)
LDL Cholesterol: 136 mg/dL — ABNORMAL HIGH (ref 0–99)
NonHDL: 150.63
TRIGLYCERIDES: 74 mg/dL (ref 0.0–149.0)
VLDL: 14.8 mg/dL (ref 0.0–40.0)

## 2016-04-16 ENCOUNTER — Telehealth: Payer: Self-pay | Admitting: Medical

## 2016-04-16 NOTE — Telephone Encounter (Signed)
Called pt, lab results and provider recommendation discussed.

## 2016-04-16 NOTE — Telephone Encounter (Signed)
Patient called requesting a call back regarding her lab results. Please advise  Phone: [phone]

## 2016-04-21 ENCOUNTER — Encounter: Payer: Self-pay | Admitting: Cardiology

## 2016-04-21 ENCOUNTER — Ambulatory Visit (INDEPENDENT_AMBULATORY_CARE_PROVIDER_SITE_OTHER): Payer: Medicare HMO | Admitting: Cardiology

## 2016-04-21 VITALS — BP 144/78 | HR 80 | Ht 62.75 in | Wt 117.4 lb

## 2016-04-21 DIAGNOSIS — E7800 Pure hypercholesterolemia, unspecified: Secondary | ICD-10-CM

## 2016-04-21 DIAGNOSIS — R002 Palpitations: Secondary | ICD-10-CM

## 2016-04-21 DIAGNOSIS — E785 Hyperlipidemia, unspecified: Secondary | ICD-10-CM | POA: Insufficient documentation

## 2016-04-21 DIAGNOSIS — R079 Chest pain, unspecified: Secondary | ICD-10-CM

## 2016-04-21 NOTE — Progress Notes (Signed)
Cardiology Office Note    Date:  04/21/2016   ID:  Jacqueline Ray, DOB 06-04-1950, MRN [id number]  PCP:  Mackie Pai, PA-C  Cardiologist:  Fransico Him, MD   Chief Complaint  Patient presents with  . Chest Pain  . Hyperlipidemia  . Irregular Heart Beat    History of Present Illness:  Jacqueline Ray is a 66 y.o. female with a history of hyperlipidemia who is referred for evaluation of palpitations and dizziness, She apparently awaked at 2am and noted sudden onset of left arm numbness associated with heart fluttering.  She got up and walked around the house and her symptoms resolved and she went back to sleep.  She awakened again at 4:30am with same symptoms and left sided dull chest discomfort.  She denied any SOB, N/Vomiting or diaphoresis.  She was hypertensive on admission.  She went to the ER and was evaluated and workup with troponin and EKG was normal.  She is now referred here for further evaluation.  She says that she had had a similar feeling a few weeks prior that was short lived.  She says that her chest discomfort felt like gas and was associated with belching which improved her chest discomfort.  She has not had any further chest discomfort since then.  She has noticed some DOE but she exercises regularly and has not interfered with her workout.  She still occasionally has some fluttering in her chest.  She gets some lightheadedness with the palpitations.  She denies any history of syncope.  She denies any LE edema, claudication, PND or orthopnea.   She has a strong family history of CAD in her Dad at 66, brother at 59 and sister at 24 (she was a smoker and diabetic).  She has never smoked.    Past Medical History:  Diagnosis Date  . Abnormal Pap smear    as of 2014 it has been over 20 yrs ago   . Hyperlipidemia   . Menorrhagia     Past Surgical History:  Procedure Laterality Date  . BASAL CELL CARCINOMA EXCISION    . CRYOTHERAPY     for abn pap. as of 2014 it has been  over 20 yrs ago since done  . DILATION AND CURETTAGE OF UTERUS     for menorrhagia  . FOOT SURGERY Left 2006   bone spur  . MOLE REMOVAL  2011   rt thigh, negative  . TUBAL LIGATION  1981  . TYMPANOSTOMY TUBE PLACEMENT      Current Medications: Current Meds  Medication Sig  . BIOTIN PO Take by mouth daily.  Marland Kitchen CALCIUM PO Take by mouth daily.  . Cholecalciferol (VITAMIN D PO) Take by mouth daily.  . fluticasone (FLONASE) 50 MCG/ACT nasal spray Place 2 sprays into both nostrils daily.  . Multiple Vitamins-Minerals (MULTIVITAMIN PO) Take by mouth daily.  . Omega-3 Fatty Acids (OMEGA 3 PO) Take by mouth daily.  Marland Kitchen VITAMIN E PO Take by mouth as needed.     Allergies:   Codeine and Darvocet [propoxyphene n-acetaminophen]   Social History   Social History  . Marital status: Married    Spouse name: N/A  . Number of children: N/A  . Years of education: N/A   Social History Main Topics  . Smoking status: Never Smoker  . Smokeless tobacco: Never Used  . Alcohol use 1.8 - 2.4 oz/week    3 - 4 Glasses of wine per week     Comment: per week  .  Drug use: No  . Sexual activity: Yes    Partners: Male    Birth control/ protection: Surgical     Comment: BTL   Other Topics Concern  . None   Social History Narrative  . None     Family History:  The patient's family history includes Cancer in her brother and paternal grandmother; Diabetes in her brother and sister; Heart disease in her father and sister; Hypertension in her mother; Osteoporosis in her sister.   ROS:   Please see the history of present illness.    ROS All other systems reviewed and are negative.  No flowsheet data found.     PHYSICAL EXAM:   VS:  BP (!) 144/78   Pulse 80   Ht 5' 2.75" (1.594 m)   Wt 117 lb 6.4 oz (53.3 kg)   LMP 03/01/1997   SpO2 99%   BMI 20.96 kg/m    GEN: Well nourished, well developed, in no acute distress  HEENT: normal  Neck: no JVD, carotid bruits, or masses Cardiac: RRR; no  murmurs, rubs, or gallops,no edema.  Intact distal pulses bilaterally.  Respiratory:  clear to auscultation bilaterally, normal work of breathing GI: soft, nontender, nondistended, + BS MS: no deformity or atrophy  Skin: warm and dry, no rash Neuro:  Alert and Oriented x 3, Strength and sensation are intact Psych: euthymic mood, full affect  Wt Readings from Last 3 Encounters:  04/21/16 117 lb 6.4 oz (53.3 kg)  04/08/16 118 lb (53.5 kg)  04/05/16 115 lb (52.2 kg)      Studies/Labs Reviewed:   EKG:  EKG is not ordered today. In ER EKG showed NSR with rSR" in V1 otherwise normal  Recent Labs: 04/05/2016: Hemoglobin 15.0; Platelets 195 04/15/2016: ALT 20; BUN 17; Creatinine, Ser 0.90; Potassium 3.7; Sodium 142   Lipid Panel    Component Value Date/Time   CHOL 246 (H) 04/15/2016 0759   TRIG 74.0 04/15/2016 0759   HDL 95.70 04/15/2016 0759   CHOLHDL 3 04/15/2016 0759   VLDL 14.8 04/15/2016 0759   LDLCALC 136 (H) 04/15/2016 0759    Additional studies/ records that were reviewed today include:  Notes from ER visit    ASSESSMENT:    1. Chest pain, unspecified type   2. Heart palpitations   3. Pure hypercholesterolemia      PLAN:  In order of problems listed above:  1. Chest pain that is somewhat atypical in that it is improved with belching but was associated with left arm numbness.  She has a strong family history of CAD and MI and she has hyperlipidemia and has not been on meds.  She is reluctant to go on lipid lowering therapy. I will set her up for a stress myoview to rule out ischemia and 2D echo to assess LVF. 2. Palpitations - I will get her set up for an event monitor to rule out PAF. 3. Hyperlipidemia - Her last LDL was 136 and HDL was 95.  She is reluctant to go on lipid lowering therapy and I have told her that since her HDL is so high she can continue diet for now unless her workup shows vascular disease.    Medication Adjustments/Labs and Tests  Ordered: Current medicines are reviewed at length with the patient today.  Concerns regarding medicines are outlined above.  Medication changes, Labs and Tests ordered today are listed in the Patient Instructions below.  There are no Patient Instructions on file for this visit.  Signed, Fransico Him, MD  04/21/2016 9:33 AM    [facility] [address] [address], [place], [place]  [id number] Phone: [phone]; Fax: [phone]

## 2016-04-21 NOTE — Patient Instructions (Signed)
Medication Instructions:  Your physician recommends that you continue on your current medications as directed. Please refer to the Current Medication list given to you today.   Labwork: None  Testing/Procedures: Your physician has requested that you have an echocardiogram. Echocardiography is a painless test that uses sound waves to create images of your heart. It provides your doctor with information about the size and shape of your heart and how well your heart's chambers and valves are working. This procedure takes approximately one hour. There are no restrictions for this procedure.   Dr. Radford Pax recommends you have a NUCLEAR STRESS TEST.  Your physician has recommended that you wear an event monitor. Event monitors are medical devices that record the heart's electrical activity. Doctors most often [place] these monitors to diagnose arrhythmias. Arrhythmias are problems with the speed or rhythm of the heartbeat. The monitor is a small, portable device. You can wear one while you do your normal daily activities. This is usually used to diagnose what is causing palpitations/syncope (passing out).  Follow-Up: Your physician recommends that you schedule a follow-up appointment AS NEEDED with Dr. Radford Pax pending study results.  Any Other Special Instructions Will Be Listed Below (If Applicable).     If you need a refill on your cardiac medications before your next appointment, please call your pharmacy.

## 2016-04-27 ENCOUNTER — Ambulatory Visit: Payer: Medicare HMO | Admitting: Cardiology

## 2016-04-27 ENCOUNTER — Ambulatory Visit (INDEPENDENT_AMBULATORY_CARE_PROVIDER_SITE_OTHER): Payer: Medicare HMO

## 2016-04-27 DIAGNOSIS — R002 Palpitations: Secondary | ICD-10-CM

## 2016-05-04 ENCOUNTER — Telehealth (HOSPITAL_COMMUNITY): Payer: Self-pay | Admitting: *Deleted

## 2016-05-04 NOTE — Telephone Encounter (Signed)
Patient given detailed instructions per Myocardial Perfusion Study Information Sheet for the test on 05/06/16. Patient notified to arrive 15 minutes early and that it is imperative to arrive on time for appointment to keep from having the test rescheduled.  If you need to cancel or reschedule your appointment, please call the office within 24 hours of your appointment. Failure to do so may result in a cancellation of your appointment, and a $50 no show fee. Patient verbalized understanding. Betsey Sossamon Jacqueline    

## 2016-05-06 ENCOUNTER — Other Ambulatory Visit: Payer: Self-pay

## 2016-05-06 ENCOUNTER — Ambulatory Visit (HOSPITAL_BASED_OUTPATIENT_CLINIC_OR_DEPARTMENT_OTHER): Payer: Medicare HMO

## 2016-05-06 ENCOUNTER — Telehealth: Payer: Self-pay

## 2016-05-06 ENCOUNTER — Ambulatory Visit (HOSPITAL_COMMUNITY): Payer: Medicare HMO | Attending: Internal Medicine

## 2016-05-06 DIAGNOSIS — I2510 Atherosclerotic heart disease of native coronary artery without angina pectoris: Secondary | ICD-10-CM | POA: Diagnosis present

## 2016-05-06 DIAGNOSIS — I10 Essential (primary) hypertension: Secondary | ICD-10-CM

## 2016-05-06 DIAGNOSIS — R002 Palpitations: Secondary | ICD-10-CM | POA: Diagnosis not present

## 2016-05-06 DIAGNOSIS — E785 Hyperlipidemia, unspecified: Secondary | ICD-10-CM | POA: Diagnosis not present

## 2016-05-06 DIAGNOSIS — E119 Type 2 diabetes mellitus without complications: Secondary | ICD-10-CM | POA: Insufficient documentation

## 2016-05-06 DIAGNOSIS — Z8249 Family history of ischemic heart disease and other diseases of the circulatory system: Secondary | ICD-10-CM | POA: Diagnosis not present

## 2016-05-06 DIAGNOSIS — R079 Chest pain, unspecified: Secondary | ICD-10-CM | POA: Insufficient documentation

## 2016-05-06 DIAGNOSIS — R0609 Other forms of dyspnea: Secondary | ICD-10-CM | POA: Diagnosis not present

## 2016-05-06 LAB — MYOCARDIAL PERFUSION IMAGING
CHL CUP NUCLEAR SDS: 4
CHL CUP NUCLEAR SRS: 8
CSEPHR: 98 %
CSEPPHR: 153 {beats}/min
Estimated workload: 7 METS
Exercise duration (min): 6 min
Exercise duration (sec): 0 s
LV dias vol: 57 mL (ref 46–106)
LVSYSVOL: 17 mL
MPHR: 155 {beats}/min
RATE: 0.27
Rest HR: 67 {beats}/min
SSS: 12
TID: 0.89

## 2016-05-06 MED ORDER — TECHNETIUM TC 99M TETROFOSMIN IV KIT
10.3000 | PACK | Freq: Once | INTRAVENOUS | Status: AC | PRN
  Administered 2016-05-06: 10.3 via INTRAVENOUS
  Filled 2016-05-06: qty 11

## 2016-05-06 MED ORDER — TECHNETIUM TC 99M TETROFOSMIN IV KIT
3.0000 | PACK | Freq: Once | INTRAVENOUS | Status: AC | PRN
  Administered 2016-05-06: 32.7 via INTRAVENOUS
  Filled 2016-05-06: qty 3

## 2016-05-06 NOTE — Telephone Encounter (Signed)
-----   Message from Sueanne Margarita, MD sent at 05/06/2016  2:14 PM EST ----- Please let patient know that stress test was fine but BP was elevated during the stress test.  Please get a 24 hour BP monitor

## 2016-05-06 NOTE — Telephone Encounter (Signed)
Informed patient of results and verbal understanding expressed.  24 hour BP monitor ordered for scheduling. Patient agrees with treatment plan.

## 2016-05-21 ENCOUNTER — Ambulatory Visit: Payer: Medicare HMO

## 2016-05-27 DIAGNOSIS — M9902 Segmental and somatic dysfunction of thoracic region: Secondary | ICD-10-CM | POA: Diagnosis not present

## 2016-05-27 DIAGNOSIS — M9901 Segmental and somatic dysfunction of cervical region: Secondary | ICD-10-CM | POA: Diagnosis not present

## 2016-05-27 DIAGNOSIS — M9903 Segmental and somatic dysfunction of lumbar region: Secondary | ICD-10-CM | POA: Diagnosis not present

## 2016-07-05 ENCOUNTER — Telehealth: Payer: Self-pay | Admitting: Cardiology

## 2016-07-05 NOTE — Telephone Encounter (Signed)
Dr Radford Pax,  I spoke with patient she states she does not need to do the BP monitor at this time.  Patient states she has been checking her bp with a home monitor and it appears to be fine with occasional  Up and down but she feels fine.    Thanks  [employer]

## 2016-07-22 DIAGNOSIS — M9901 Segmental and somatic dysfunction of cervical region: Secondary | ICD-10-CM | POA: Diagnosis not present

## 2016-07-22 DIAGNOSIS — M9903 Segmental and somatic dysfunction of lumbar region: Secondary | ICD-10-CM | POA: Diagnosis not present

## 2016-07-22 DIAGNOSIS — M9902 Segmental and somatic dysfunction of thoracic region: Secondary | ICD-10-CM | POA: Diagnosis not present

## 2016-09-09 DIAGNOSIS — M9902 Segmental and somatic dysfunction of thoracic region: Secondary | ICD-10-CM | POA: Diagnosis not present

## 2016-09-09 DIAGNOSIS — M9903 Segmental and somatic dysfunction of lumbar region: Secondary | ICD-10-CM | POA: Diagnosis not present

## 2016-09-09 DIAGNOSIS — M9901 Segmental and somatic dysfunction of cervical region: Secondary | ICD-10-CM | POA: Diagnosis not present

## 2016-09-15 DIAGNOSIS — R69 Illness, unspecified: Secondary | ICD-10-CM | POA: Diagnosis not present

## 2016-10-19 DIAGNOSIS — H52223 Regular astigmatism, bilateral: Secondary | ICD-10-CM | POA: Diagnosis not present

## 2016-10-19 DIAGNOSIS — H5203 Hypermetropia, bilateral: Secondary | ICD-10-CM | POA: Diagnosis not present

## 2016-10-19 DIAGNOSIS — H524 Presbyopia: Secondary | ICD-10-CM | POA: Diagnosis not present

## 2016-10-21 DIAGNOSIS — R69 Illness, unspecified: Secondary | ICD-10-CM | POA: Diagnosis not present

## 2016-11-04 DIAGNOSIS — M9901 Segmental and somatic dysfunction of cervical region: Secondary | ICD-10-CM | POA: Diagnosis not present

## 2016-11-04 DIAGNOSIS — M9903 Segmental and somatic dysfunction of lumbar region: Secondary | ICD-10-CM | POA: Diagnosis not present

## 2016-11-04 DIAGNOSIS — M9902 Segmental and somatic dysfunction of thoracic region: Secondary | ICD-10-CM | POA: Diagnosis not present

## 2016-11-04 DIAGNOSIS — M791 Myalgia: Secondary | ICD-10-CM | POA: Diagnosis not present

## 2016-11-04 DIAGNOSIS — R51 Headache: Secondary | ICD-10-CM | POA: Diagnosis not present

## 2017-01-01 DIAGNOSIS — R69 Illness, unspecified: Secondary | ICD-10-CM | POA: Diagnosis not present

## 2017-01-06 DIAGNOSIS — M791 Myalgia, unspecified site: Secondary | ICD-10-CM | POA: Diagnosis not present

## 2017-01-06 DIAGNOSIS — M9902 Segmental and somatic dysfunction of thoracic region: Secondary | ICD-10-CM | POA: Diagnosis not present

## 2017-01-06 DIAGNOSIS — M9903 Segmental and somatic dysfunction of lumbar region: Secondary | ICD-10-CM | POA: Diagnosis not present

## 2017-01-06 DIAGNOSIS — R51 Headache: Secondary | ICD-10-CM | POA: Diagnosis not present

## 2017-01-06 DIAGNOSIS — M9901 Segmental and somatic dysfunction of cervical region: Secondary | ICD-10-CM | POA: Diagnosis not present

## 2017-01-13 ENCOUNTER — Telehealth: Payer: Self-pay | Admitting: Medical

## 2017-01-13 NOTE — Telephone Encounter (Signed)
Copied from [place] [id number]. Topic: Medicare AWV >> Jan 13, 2017  9:59 AM Gerrie Nordmann wrote: CRM for notification. See Telephone encounter for:  01/13/17.  Called patient to schedule Welcome to Medicare Visit. Please have patient call [phone] to schedule appt. SF

## 2017-03-03 DIAGNOSIS — M9903 Segmental and somatic dysfunction of lumbar region: Secondary | ICD-10-CM | POA: Diagnosis not present

## 2017-03-03 DIAGNOSIS — M9901 Segmental and somatic dysfunction of cervical region: Secondary | ICD-10-CM | POA: Diagnosis not present

## 2017-03-03 DIAGNOSIS — R51 Headache: Secondary | ICD-10-CM | POA: Diagnosis not present

## 2017-03-03 DIAGNOSIS — M9902 Segmental and somatic dysfunction of thoracic region: Secondary | ICD-10-CM | POA: Diagnosis not present

## 2017-03-03 DIAGNOSIS — M791 Myalgia, unspecified site: Secondary | ICD-10-CM | POA: Diagnosis not present

## 2017-03-08 ENCOUNTER — Telehealth: Payer: Self-pay

## 2017-03-08 NOTE — Telephone Encounter (Signed)
So am willing to take on patient but am unlikely to have a new patient spot in Feb

## 2017-03-08 NOTE — Telephone Encounter (Signed)
Ok with transfer to pcp or her choice.

## 2017-03-08 NOTE — Telephone Encounter (Signed)
Requesting transfer of care of care from [facility] to [facility] or Dr. Lorelei Pont.

## 2017-03-08 NOTE — Telephone Encounter (Signed)
Copied from [place]. Topic: Inquiry >> Mar 08, 2017  2:19 PM Neva Seat wrote: Pt wants to establish care with Dr. Charlett Blake or Dr. Lorelei Pont.  She states that she chose Dr. Harvie Heck because he was able to take her sooner at the time she needed to be seen.  She also wants to schdule a CPE in Feb. With Dr. Charlett Blake or Dr. Lorelei Pont.   Please call pt back to let her know and when she can be scheduled.

## 2017-03-09 NOTE — Telephone Encounter (Signed)
Called Jacqueline Ray and LVM for Jacqueline Ray to call and set up an appt with either Dr Charlett Blake or Dr Lorelei Pont, when they have availability

## 2017-03-09 NOTE — Telephone Encounter (Signed)
I also probably do not have a new pt spot in February but if I do she can have it

## 2017-03-24 DIAGNOSIS — R69 Illness, unspecified: Secondary | ICD-10-CM | POA: Diagnosis not present

## 2017-03-31 DIAGNOSIS — R69 Illness, unspecified: Secondary | ICD-10-CM | POA: Diagnosis not present

## 2017-04-12 NOTE — Progress Notes (Deleted)
Subjective:   Jacqueline Ray is a 67 y.o. female who presents for an Initial Medicare Annual Wellness Visit. The Patient was informed that the wellness visit is to identify future health risk and educate and initiate measures that can reduce risk for increased disease through the lifespan.   Describes health as fair, good or great?  Review of Systems   Physical assessment deferred to PCP.   Sleep patterns:  Home Safety/Smoke Alarms: Feels safe in home. Smoke alarms in place.  Living environment; residence and Firearm Safety:  [place] Safety/Bike Helmet: Wears seat belt.   Female:   Pap- last 04/04/15-normal       Mammo-       Dexa scan-        CCS-     Objective:    There were no vitals filed for this visit. There is no height or weight on file to calculate BMI.  Advanced Directives 04/05/2016  Does Patient Have a Medical Advance Directive? No  Would patient like information on creating a medical advance directive? No - Patient declined    Current Medications (verified) Outpatient Encounter Medications as of 04/19/2017  Medication Sig  . BIOTIN PO Take by mouth daily.  Marland Kitchen CALCIUM PO Take by mouth daily.  . Cholecalciferol (VITAMIN D PO) Take by mouth daily.  . fluticasone (FLONASE) 50 MCG/ACT nasal spray Place 2 sprays into both nostrils daily.  . Multiple Vitamins-Minerals (MULTIVITAMIN PO) Take by mouth daily.  . Omega-3 Fatty Acids (OMEGA 3 PO) Take by mouth daily.  Marland Kitchen VITAMIN E PO Take by mouth as needed.    No facility-administered encounter medications on file as of 04/19/2017.     Allergies (verified) Codeine and Darvocet [propoxyphene n-acetaminophen]   History: Past Medical History:  Diagnosis Date  . Abnormal Pap smear    as of 2014 it has been over 20 yrs ago   . Hyperlipidemia   . Menorrhagia    Past Surgical History:  Procedure Laterality Date  . BASAL CELL CARCINOMA EXCISION    . CRYOTHERAPY     for abn pap. as of 2014 it has been over 20 yrs ago  since done  . DILATION AND CURETTAGE OF UTERUS     for menorrhagia  . FOOT SURGERY Left 2006   bone spur  . MOLE REMOVAL  2011   rt thigh, negative  . TUBAL LIGATION  1981  . TYMPANOSTOMY TUBE PLACEMENT     Family History  Problem Relation Age of Onset  . Osteoporosis Sister   . Heart disease Sister        pacemaker  . Diabetes Sister   . Diabetes Brother   . Cancer Brother        prostate  . Hypertension Mother   . Heart disease Father   . Cancer Paternal Grandmother    Social History   Socioeconomic History  . Marital status: Married    Spouse name: Not on file  . Number of children: Not on file  . Years of education: Not on file  . Highest education level: Not on file  Social Needs  . Financial resource strain: Not on file  . Food insecurity - worry: Not on file  . Food insecurity - inability: Not on file  . Transportation needs - medical: Not on file  . Transportation needs - non-medical: Not on file  Occupational History  . Not on file  Tobacco Use  . Smoking status: Never Smoker  . Smokeless tobacco:  Never Used  Substance and Sexual Activity  . Alcohol use: Yes    Alcohol/week: 1.8 - 2.4 oz    Types: 3 - 4 Glasses of wine per week    Comment: per week  . Drug use: No  . Sexual activity: Yes    Partners: Male    Birth control/protection: Surgical    Comment: BTL  Other Topics Concern  . Not on file  Social History Narrative  . Not on file    Tobacco Counseling Counseling given: Not Answered   Clinical Intake:                        Activities of Daily Living No flowsheet data found.   Immunizations and Health Maintenance Immunization History  Administered Date(s) Administered  . Tdap 12/31/2010   Health Maintenance Due  Topic Date Due  . COLONOSCOPY  03/02/2001  . DEXA SCAN  03/02/2016  . PNA vac Low Risk Adult (1 of 2 - PCV13) 03/02/2016  . INFLUENZA VACCINE  09/29/2016    Patient Care Team: Saguier, Iris Pert  as PCP - General (Internal Medicine)  Indicate any recent Medical Services you may have received from other than [facility] providers in the past year (date may be approximate).     Assessment:   This is a routine wellness examination for [name]. Physical assessment deferred to PCP.  Hearing/Vision screen No exam data present  Dietary issues and exercise activities discussed:   Diet (meal preparation, eat out, water intake, caffeinated beverages, dairy products, fruits and vegetables): {Desc; diets:16563} Breakfast: Lunch:  Dinner:      Goals    None     Depression Screen No flowsheet data found.  Fall Risk No flowsheet data found.  Cognitive Function:        Screening Tests Health Maintenance  Topic Date Due  . COLONOSCOPY  03/02/2001  . DEXA SCAN  03/02/2016  . PNA vac Low Risk Adult (1 of 2 - PCV13) 03/02/2016  . INFLUENZA VACCINE  09/29/2016  . MAMMOGRAM  03/08/2018  . TETANUS/TDAP  12/30/2020  . Hepatitis C Screening  Completed      Plan:   ***  I have personally reviewed and noted the following in the patient's chart:   . Medical and social history . Use of alcohol, tobacco or illicit drugs  . Current medications and supplements . Functional ability and status . Nutritional status . Physical activity . Advanced directives . List of other physicians . Hospitalizations, surgeries, and ER visits in previous 12 months . Vitals . Screenings to include cognitive, depression, and falls . Referrals and appointments  In addition, I have reviewed and discussed with patient certain preventive protocols, quality metrics, and best practice recommendations. A written personalized care plan for preventive services as well as general preventive health recommendations were provided to patient.     Shela Nevin, [place]   04/12/2017

## 2017-04-14 ENCOUNTER — Encounter: Payer: Self-pay | Admitting: Family Medicine

## 2017-04-14 ENCOUNTER — Ambulatory Visit (INDEPENDENT_AMBULATORY_CARE_PROVIDER_SITE_OTHER): Payer: Medicare HMO | Admitting: Family Medicine

## 2017-04-14 ENCOUNTER — Encounter (INDEPENDENT_AMBULATORY_CARE_PROVIDER_SITE_OTHER): Payer: Self-pay

## 2017-04-14 VITALS — BP 138/82 | HR 77 | Temp 98.0°F | Resp 18 | Wt 116.6 lb

## 2017-04-14 DIAGNOSIS — H04123 Dry eye syndrome of bilateral lacrimal glands: Secondary | ICD-10-CM

## 2017-04-14 DIAGNOSIS — L578 Other skin changes due to chronic exposure to nonionizing radiation: Secondary | ICD-10-CM | POA: Diagnosis not present

## 2017-04-14 DIAGNOSIS — Z8619 Personal history of other infectious and parasitic diseases: Secondary | ICD-10-CM

## 2017-04-14 DIAGNOSIS — E782 Mixed hyperlipidemia: Secondary | ICD-10-CM

## 2017-04-14 DIAGNOSIS — Z23 Encounter for immunization: Secondary | ICD-10-CM

## 2017-04-14 DIAGNOSIS — K219 Gastro-esophageal reflux disease without esophagitis: Secondary | ICD-10-CM | POA: Diagnosis not present

## 2017-04-14 DIAGNOSIS — T7840XA Allergy, unspecified, initial encounter: Secondary | ICD-10-CM

## 2017-04-14 DIAGNOSIS — C4491 Basal cell carcinoma of skin, unspecified: Secondary | ICD-10-CM

## 2017-04-14 DIAGNOSIS — Z0000 Encounter for general adult medical examination without abnormal findings: Secondary | ICD-10-CM | POA: Diagnosis not present

## 2017-04-14 LAB — TSH: TSH: 1.23 u[IU]/mL (ref 0.35–4.50)

## 2017-04-14 LAB — CBC
HEMATOCRIT: 45.2 % (ref 36.0–46.0)
Hemoglobin: 15 g/dL (ref 12.0–15.0)
MCHC: 33.2 g/dL (ref 30.0–36.0)
MCV: 92.3 fl (ref 78.0–100.0)
Platelets: 199 10*3/uL (ref 150.0–400.0)
RBC: 4.9 Mil/uL (ref 3.87–5.11)
RDW: 13.7 % (ref 11.5–15.5)
WBC: 6 10*3/uL (ref 4.0–10.5)

## 2017-04-14 LAB — LIPID PANEL
Cholesterol: 208 mg/dL — ABNORMAL HIGH (ref 0–200)
HDL: 85.4 mg/dL (ref >39.00)
LDL Cholesterol: 112 mg/dL — ABNORMAL HIGH (ref 0–99)
NONHDL: 122.87
Total CHOL/HDL Ratio: 2
Triglycerides: 54 mg/dL (ref 0.0–149.0)
VLDL: 10.8 mg/dL (ref 0.0–40.0)

## 2017-04-14 LAB — COMPREHENSIVE METABOLIC PANEL
ALBUMIN: 4.1 g/dL (ref 3.5–5.2)
ALT: 17 U/L (ref 0–35)
AST: 23 U/L (ref 0–37)
Alkaline Phosphatase: 57 U/L (ref 39–117)
BUN: 13 mg/dL (ref 6–23)
CO2: 32 mEq/L (ref 19–32)
CREATININE: 0.81 mg/dL (ref 0.40–1.20)
Calcium: 9.1 mg/dL (ref 8.4–10.5)
Chloride: 106 mEq/L (ref 96–112)
GFR: 75.16 mL/min (ref >60.00)
GLUCOSE: 94 mg/dL (ref 70–99)
POTASSIUM: 3.9 meq/L (ref 3.5–5.1)
SODIUM: 142 meq/L (ref 135–145)
Total Bilirubin: 0.5 mg/dL (ref 0.2–1.2)
Total Protein: 6.8 g/dL (ref 6.0–8.3)

## 2017-04-14 NOTE — Progress Notes (Signed)
Subjective:  I acted as a Education administrator for Dr. Charlett Blake. [place], [place]  Patient ID: Jacqueline Ray, female    DOB: Oct 21, 1950, 67 y.o.   MRN: [id number]  No chief complaint on file.   HPI  Patient is in today to establish care, she feels well today but has a past medical history of allergies, hyperlipidemia, reflux and basal cell carcinoma. No recent febrile illness or hospitalizations. She tries to stay active and eat a heart healthy diet. She has a spot on her chest she wants to have looked at. She notes some heart burn at times especially when she eats close to bed time. She responds to Zantac and Tums. Denies CP/palp/SOB/HA/congestion/fevers or GU c/o. Taking meds as prescribed  Patient Care Team: Saguier, Iris Pert as PCP - General (Internal Medicine)   Past Medical History:  Diagnosis Date  . Abnormal Pap smear    as of 2014 it has been over 20 yrs ago   . Acid reflux 04/17/2017  . BCC (basal cell carcinoma of skin) 04/17/2017  . H/O measles   . History of chicken pox   . Hyperlipidemia   . Menorrhagia     Past Surgical History:  Procedure Laterality Date  . BASAL CELL CARCINOMA EXCISION     right posterior leg  . CRYOTHERAPY     for abn pap. as of 2014 it has been over 20 yrs ago since done  . DILATION AND CURETTAGE OF UTERUS     for menorrhagia  . FOOT SURGERY Left 2006   bone spur  . MOLE REMOVAL  2011   rt thigh, negative  . TUBAL LIGATION  1981  . TYMPANOSTOMY TUBE PLACEMENT      Family History  Problem Relation Age of Onset  . Osteoporosis Sister   . Heart disease Sister 44       pacemaker s/p MI  . Diabetes Sister   . Cancer Sister        liver  . Diabetes Brother   . Kidney disease Brother   . Cancer Brother        prostate  . Hypertension Mother   . Kidney disease Mother        dialysis, enlarged  . Heart disease Father   . Hypertension Father   . Cancer Paternal Grandmother     Social History   Socioeconomic History  . Marital status: Married   Spouse name: Not on file  . Number of children: Not on file  . Years of education: Not on file  . Highest education level: Not on file  Social Needs  . Financial resource strain: Not on file  . Food insecurity - worry: Not on file  . Food insecurity - inability: Not on file  . Transportation needs - medical: Not on file  . Transportation needs - non-medical: Not on file  Occupational History  . Not on file  Tobacco Use  . Smoking status: Never Smoker  . Smokeless tobacco: Never Used  Substance and Sexual Activity  . Alcohol use: Yes    Alcohol/week: 1.8 - 2.4 oz    Types: 3 - 4 Glasses of wine per week    Comment: per week  . Drug use: No  . Sexual activity: Yes    Partners: Male    Birth control/protection: Surgical    Comment: BTL  Other Topics Concern  . Not on file  Social History Narrative   Works with [employer] in [place]. Lives with husband, no  pets. No dietary restrictions, exercises regularly works out 3 x a week. Wears a seat belt.    Outpatient Medications Prior to Visit  Medication Sig Dispense Refill  . BIOTIN PO Take by mouth daily.    Marland Kitchen CALCIUM PO Take by mouth daily.    . Cholecalciferol (VITAMIN D PO) Take by mouth daily.    . fluticasone (FLONASE) 50 MCG/ACT nasal spray Place 2 sprays into both nostrils daily. 16 g 3  . Multiple Vitamins-Minerals (MULTIVITAMIN PO) Take by mouth daily.    . Omega-3 Fatty Acids (OMEGA 3 PO) Take by mouth daily.    Marland Kitchen VITAMIN E PO Take by mouth as needed.      No facility-administered medications prior to visit.     Allergies  Allergen Reactions  . Codeine   . Darvocet [Propoxyphene N-Acetaminophen] Nausea And Vomiting    Review of Systems  Constitutional: Negative for fever and malaise/fatigue.  HENT: Negative for congestion.   Eyes: Negative for blurred vision.  Respiratory: Negative for cough and shortness of breath.   Cardiovascular: Negative for palpitations and leg swelling.  Gastrointestinal: Positive  for heartburn. Negative for vomiting.  Musculoskeletal: Negative for back pain.  Skin: Negative for rash.  Neurological: Negative for loss of consciousness and headaches.       Objective:    Physical Exam  Constitutional: She is oriented to person, place, and time. She appears well-developed and well-nourished. No distress.  HENT:  Head: Normocephalic and atraumatic.  Eyes: Conjunctivae are normal.  Neck: Normal range of motion. No thyromegaly present.  Cardiovascular: Normal rate and regular rhythm.  Pulmonary/Chest: Effort normal and breath sounds normal. She has no wheezes.  Abdominal: Soft. Bowel sounds are normal. There is no tenderness.  Musculoskeletal: Normal range of motion. She exhibits no edema or deformity.  Neurological: She is alert and oriented to person, place, and time.  Skin: Skin is warm and dry. She is not diaphoretic.  3-4 mm raised reactive lesion right upper chest wall.   Psychiatric: She has a normal mood and affect.    BP 138/82 (BP Location: Left Arm, Patient Position: Sitting, Cuff Size: Normal)   Pulse 77   Temp 98 F (36.7 C) (Oral)   Resp 18   Wt 116 lb 9.6 oz (52.9 kg)   LMP 03/01/1997   SpO2 98%   BMI 21.33 kg/m  Wt Readings from Last 3 Encounters:  04/14/17 116 lb 9.6 oz (52.9 kg)  05/06/16 117 lb (53.1 kg)  04/21/16 117 lb 6.4 oz (53.3 kg)   BP Readings from Last 3 Encounters:  04/14/17 138/82  04/21/16 (!) 144/78  04/08/16 140/81     Immunization History  Administered Date(s) Administered  . Pneumococcal Conjugate-13 04/14/2017  . Tdap 12/31/2010    Health Maintenance  Topic Date Due  . COLONOSCOPY  03/02/2001  . DEXA SCAN  03/02/2016  . MAMMOGRAM  03/08/2018  . PNA vac Low Risk Adult (2 of 2 - PPSV23) 04/14/2018  . TETANUS/TDAP  12/30/2020  . INFLUENZA VACCINE  Completed  . Hepatitis C Screening  Completed    Lab Results  Component Value Date   WBC 6.0 04/14/2017   HGB 15.0 04/14/2017   HCT 45.2 04/14/2017   PLT  199.0 04/14/2017   GLUCOSE 94 04/14/2017   CHOL 208 (H) 04/14/2017   TRIG 54.0 04/14/2017   HDL 85.40 04/14/2017   LDLCALC 112 (H) 04/14/2017   ALT 17 04/14/2017   AST 23 04/14/2017   NA 142 04/14/2017  K 3.9 04/14/2017   CL 106 04/14/2017   CREATININE 0.81 04/14/2017   BUN 13 04/14/2017   CO2 32 04/14/2017   TSH 1.23 04/14/2017    Lab Results  Component Value Date   TSH 1.23 04/14/2017   Lab Results  Component Value Date   WBC 6.0 04/14/2017   HGB 15.0 04/14/2017   HCT 45.2 04/14/2017   MCV 92.3 04/14/2017   PLT 199.0 04/14/2017   Lab Results  Component Value Date   NA 142 04/14/2017   K 3.9 04/14/2017   CO2 32 04/14/2017   GLUCOSE 94 04/14/2017   BUN 13 04/14/2017   CREATININE 0.81 04/14/2017   BILITOT 0.5 04/14/2017   ALKPHOS 57 04/14/2017   AST 23 04/14/2017   ALT 17 04/14/2017   PROT 6.8 04/14/2017   ALBUMIN 4.1 04/14/2017   CALCIUM 9.1 04/14/2017   ANIONGAP 8 04/05/2016   GFR 75.16 04/14/2017   Lab Results  Component Value Date   CHOL 208 (H) 04/14/2017   Lab Results  Component Value Date   HDL 85.40 04/14/2017   Lab Results  Component Value Date   LDLCALC 112 (H) 04/14/2017   Lab Results  Component Value Date   TRIG 54.0 04/14/2017   Lab Results  Component Value Date   CHOLHDL 2 04/14/2017   No results found for: HGBA1C       Assessment & Plan:   Problem List Items Addressed This Visit    Hyperlipidemia    Encouraged heart healthy diet, increase exercise, avoid trans fats, consider a krill oil cap daily      Relevant Orders   Lipid panel (Completed)   History of chicken pox    Encouraged her to proceed with Shingrix immunizations.       Dry eyes, bilateral    Follows with opthamology and uses hydrating drops      Acid reflux    Infrequent and responds to Tums and Zantac. Avoid offending foods, start probiotics. Do not eat large meals in late evening and consider raising head of bed.       Sun-damaged skin     Referred to dermatology for further consideration      Relevant Orders   Ambulatory referral to Dermatology   BCC (basal cell carcinoma of skin)   Allergic state    Uses Flonase prn with good results encouraged to consider Zyrtec prn      Preventative health care - Primary    Patient encouraged to maintain heart healthy diet, regular exercise, adequate sleep. Consider daily probiotics. Take medications as prescribed. Given prevnar shot      Relevant Orders   CBC (Completed)   Comprehensive metabolic panel (Completed)   TSH (Completed)    Other Visit Diagnoses    Need for pneumococcal vaccination       Relevant Orders   Pneumococcal conjugate vaccine 13-valent IM (Completed)      I am having Charlotta Newton maintain her Omega-3 Fatty Acids (OMEGA 3 PO), VITAMIN E PO, CALCIUM PO, BIOTIN PO, Multiple Vitamins-Minerals (MULTIVITAMIN PO), Cholecalciferol (VITAMIN D PO), and fluticasone.  No orders of the defined types were placed in this encounter.   CMA served as Education administrator during this visit. History, Physical and Plan performed by medical provider. Documentation and orders reviewed and attested to.  Penni Homans, MD

## 2017-04-14 NOTE — Patient Instructions (Addendum)
Shingrix is the new shingles, 2 shots over 2-6 months at pharmacy  Call GYN for name of gastroenterologist you did Colonoscopy with   Recommend calcium intake of 1200 to 1500 mg daily, divided into roughly 3 doses. Best source is the diet and a single dairy serving is about 500 mg, a supplement of calcium citrate once or twice daily to balance diet is fine if not getting enough in diet. Also need Vitamin D 2000 IU caps, 1 cap daily if not already taking vitamin D. Also recommend weight baring exercise on hips and upper body to keep bones strong  Preventive Care 65 Years and Older, Female Preventive care refers to lifestyle choices and visits with your health care provider that can promote health and wellness. What does preventive care include?  A yearly physical exam. This is also called an annual well check.  Dental exams once or twice a year.  Routine eye exams. Ask your health care provider how often you should have your eyes checked.  Personal lifestyle choices, including: ? Daily care of your teeth and gums. ? Regular physical activity. ? Eating a healthy diet. ? Avoiding tobacco and drug use. ? Limiting alcohol use. ? Practicing safe sex. ? Taking low-dose aspirin every day. ? Taking vitamin and mineral supplements as recommended by your health care provider. What happens during an annual well check? The services and screenings done by your health care provider during your annual well check will depend on your age, overall health, lifestyle risk factors, and family history of disease. Counseling Your health care provider may ask you questions about your:  Alcohol use.  Tobacco use.  Drug use.  Emotional well-being.  Home and relationship well-being.  Sexual activity.  Eating habits.  History of falls.  Memory and ability to understand (cognition).  Work and work Statistician.  Reproductive health.  Screening You may have the following tests or  measurements:  Height, weight, and BMI.  Blood pressure.  Lipid and cholesterol levels. These may be checked every 5 years, or more frequently if you are over 74 years old.  Skin check.  Lung cancer screening. You may have this screening every year starting at age 17 if you have a 30-pack-year history of smoking and currently smoke or have quit within the past 15 years.  Fecal occult blood test (FOBT) of the stool. You may have this test every year starting at age 27.  Flexible sigmoidoscopy or colonoscopy. You may have a sigmoidoscopy every 5 years or a colonoscopy every 10 years starting at age 40.  Hepatitis C blood test.  Hepatitis B blood test.  Sexually transmitted disease (STD) testing.  Diabetes screening. This is done by checking your blood sugar (glucose) after you have not eaten for a while (fasting). You may have this done every 1-3 years.  Bone density scan. This is done to screen for osteoporosis. You may have this done starting at age 25.  Mammogram. This may be done every 1-2 years. Talk to your health care provider about how often you should have regular mammograms.  Talk with your health care provider about your test results, treatment options, and if necessary, the need for more tests. Vaccines Your health care provider may recommend certain vaccines, such as:  Influenza vaccine. This is recommended every year.  Tetanus, diphtheria, and acellular pertussis (Tdap, Td) vaccine. You may need a Td booster every 10 years.  Varicella vaccine. You may need this if you have not been vaccinated.  Zoster  vaccine. You may need this after age 30.  Measles, mumps, and rubella (MMR) vaccine. You may need at least one dose of MMR if you were born in 1957 or later. You may also need a second dose.  Pneumococcal 13-valent conjugate (PCV13) vaccine. One dose is recommended after age 5.  Pneumococcal polysaccharide (PPSV23) vaccine. One dose is recommended after age  53.  Meningococcal vaccine. You may need this if you have certain conditions.  Hepatitis A vaccine. You may need this if you have certain conditions or if you travel or work in places where you may be exposed to hepatitis A.  Hepatitis B vaccine. You may need this if you have certain conditions or if you travel or work in places where you may be exposed to hepatitis B.  Haemophilus influenzae type b (Hib) vaccine. You may need this if you have certain conditions.  Talk to your health care provider about which screenings and vaccines you need and how often you need them. This information is not intended to replace advice given to you by your health care provider. Make sure you discuss any questions you have with your health care provider. Document Released: 03/14/2015 Document Revised: 11/05/2015 Document Reviewed: 12/17/2014 Elsevier Interactive Patient Education  [employer].

## 2017-04-17 ENCOUNTER — Encounter: Payer: Self-pay | Admitting: Family Medicine

## 2017-04-17 DIAGNOSIS — C4491 Basal cell carcinoma of skin, unspecified: Secondary | ICD-10-CM

## 2017-04-17 DIAGNOSIS — T7840XA Allergy, unspecified, initial encounter: Secondary | ICD-10-CM | POA: Insufficient documentation

## 2017-04-17 DIAGNOSIS — Z0000 Encounter for general adult medical examination without abnormal findings: Secondary | ICD-10-CM | POA: Insufficient documentation

## 2017-04-17 DIAGNOSIS — K219 Gastro-esophageal reflux disease without esophagitis: Secondary | ICD-10-CM

## 2017-04-17 DIAGNOSIS — L578 Other skin changes due to chronic exposure to nonionizing radiation: Secondary | ICD-10-CM | POA: Insufficient documentation

## 2017-04-17 HISTORY — DX: Basal cell carcinoma of skin, unspecified: C44.91

## 2017-04-17 HISTORY — DX: Gastro-esophageal reflux disease without esophagitis: K21.9

## 2017-04-17 NOTE — Assessment & Plan Note (Signed)
Infrequent and responds to Tums and Zantac. Avoid offending foods, start probiotics. Do not eat large meals in late evening and consider raising head of bed.

## 2017-04-17 NOTE — Assessment & Plan Note (Signed)
Follows with opthamology and uses hydrating drops

## 2017-04-17 NOTE — Assessment & Plan Note (Signed)
Referred to dermatology for further consideration.  

## 2017-04-17 NOTE — Assessment & Plan Note (Signed)
Patient encouraged to maintain heart healthy diet, regular exercise, adequate sleep. Consider daily probiotics. Take medications as prescribed. Given prevnar shot

## 2017-04-17 NOTE — Assessment & Plan Note (Signed)
Encouraged heart healthy diet, increase exercise, avoid trans fats, consider a krill oil cap daily 

## 2017-04-17 NOTE — Assessment & Plan Note (Signed)
Uses Flonase prn with good results encouraged to consider Zyrtec prn

## 2017-04-17 NOTE — Assessment & Plan Note (Signed)
Encouraged her to proceed with Shingrix immunizations.

## 2017-04-18 MED ORDER — FLUTICASONE PROPIONATE 50 MCG/ACT NA SUSP: 2.0000 | Freq: Every day | NASAL | 3 refills | Status: DC

## 2017-04-19 ENCOUNTER — Ambulatory Visit: Payer: Medicare HMO | Admitting: *Deleted

## 2017-04-28 DIAGNOSIS — M9903 Segmental and somatic dysfunction of lumbar region: Secondary | ICD-10-CM | POA: Diagnosis not present

## 2017-04-28 DIAGNOSIS — M791 Myalgia, unspecified site: Secondary | ICD-10-CM | POA: Diagnosis not present

## 2017-04-28 DIAGNOSIS — M9902 Segmental and somatic dysfunction of thoracic region: Secondary | ICD-10-CM | POA: Diagnosis not present

## 2017-04-28 DIAGNOSIS — M5416 Radiculopathy, lumbar region: Secondary | ICD-10-CM | POA: Diagnosis not present

## 2017-04-28 DIAGNOSIS — M5414 Radiculopathy, thoracic region: Secondary | ICD-10-CM | POA: Diagnosis not present

## 2017-04-28 DIAGNOSIS — M531 Cervicobrachial syndrome: Secondary | ICD-10-CM | POA: Diagnosis not present

## 2017-04-28 DIAGNOSIS — M9901 Segmental and somatic dysfunction of cervical region: Secondary | ICD-10-CM | POA: Diagnosis not present

## 2017-04-28 DIAGNOSIS — R51 Headache: Secondary | ICD-10-CM | POA: Diagnosis not present

## 2017-05-06 ENCOUNTER — Encounter: Payer: Self-pay | Admitting: Family Medicine

## 2017-05-31 DIAGNOSIS — R69 Illness, unspecified: Secondary | ICD-10-CM | POA: Diagnosis not present

## 2017-06-06 DIAGNOSIS — R69 Illness, unspecified: Secondary | ICD-10-CM | POA: Diagnosis not present

## 2017-06-27 ENCOUNTER — Encounter: Payer: Self-pay | Admitting: Family Medicine

## 2017-06-27 NOTE — Progress Notes (Signed)
[place] 05/19/12. Diagnosis Colon, Transverese proximal polyp: -Post Inflammatory Polyp -No dysplasia or malignancy identified.  One 5mm polyp in the proximal transverse colon removed by cold biopsy. Entire colon is normal on direct and retroflexion views. Ileu, normal.   See full report in Media.

## 2017-06-30 DIAGNOSIS — M5416 Radiculopathy, lumbar region: Secondary | ICD-10-CM | POA: Diagnosis not present

## 2017-06-30 DIAGNOSIS — M531 Cervicobrachial syndrome: Secondary | ICD-10-CM | POA: Diagnosis not present

## 2017-06-30 DIAGNOSIS — M9903 Segmental and somatic dysfunction of lumbar region: Secondary | ICD-10-CM | POA: Diagnosis not present

## 2017-06-30 DIAGNOSIS — M5414 Radiculopathy, thoracic region: Secondary | ICD-10-CM | POA: Diagnosis not present

## 2017-06-30 DIAGNOSIS — M9901 Segmental and somatic dysfunction of cervical region: Secondary | ICD-10-CM | POA: Diagnosis not present

## 2017-06-30 DIAGNOSIS — M9902 Segmental and somatic dysfunction of thoracic region: Secondary | ICD-10-CM | POA: Diagnosis not present

## 2017-08-24 DIAGNOSIS — M5416 Radiculopathy, lumbar region: Secondary | ICD-10-CM | POA: Diagnosis not present

## 2017-08-24 DIAGNOSIS — M9903 Segmental and somatic dysfunction of lumbar region: Secondary | ICD-10-CM | POA: Diagnosis not present

## 2017-08-24 DIAGNOSIS — M9901 Segmental and somatic dysfunction of cervical region: Secondary | ICD-10-CM | POA: Diagnosis not present

## 2017-08-24 DIAGNOSIS — M5414 Radiculopathy, thoracic region: Secondary | ICD-10-CM | POA: Diagnosis not present

## 2017-08-24 DIAGNOSIS — M9902 Segmental and somatic dysfunction of thoracic region: Secondary | ICD-10-CM | POA: Diagnosis not present

## 2017-08-24 DIAGNOSIS — M531 Cervicobrachial syndrome: Secondary | ICD-10-CM | POA: Diagnosis not present

## 2017-09-22 DIAGNOSIS — M9901 Segmental and somatic dysfunction of cervical region: Secondary | ICD-10-CM | POA: Diagnosis not present

## 2017-09-22 DIAGNOSIS — M531 Cervicobrachial syndrome: Secondary | ICD-10-CM | POA: Diagnosis not present

## 2017-09-22 DIAGNOSIS — M5416 Radiculopathy, lumbar region: Secondary | ICD-10-CM | POA: Diagnosis not present

## 2017-09-22 DIAGNOSIS — M9902 Segmental and somatic dysfunction of thoracic region: Secondary | ICD-10-CM | POA: Diagnosis not present

## 2017-09-22 DIAGNOSIS — M9903 Segmental and somatic dysfunction of lumbar region: Secondary | ICD-10-CM | POA: Diagnosis not present

## 2017-09-22 DIAGNOSIS — M5414 Radiculopathy, thoracic region: Secondary | ICD-10-CM | POA: Diagnosis not present

## 2017-10-18 ENCOUNTER — Encounter: Payer: Self-pay | Admitting: Family Medicine

## 2017-10-18 DIAGNOSIS — L578 Other skin changes due to chronic exposure to nonionizing radiation: Secondary | ICD-10-CM

## 2017-11-03 ENCOUNTER — Encounter: Payer: Self-pay | Admitting: Family Medicine

## 2017-11-03 DIAGNOSIS — D234 Other benign neoplasm of skin of scalp and neck: Secondary | ICD-10-CM | POA: Diagnosis not present

## 2017-11-03 DIAGNOSIS — C44612 Basal cell carcinoma of skin of right upper limb, including shoulder: Secondary | ICD-10-CM | POA: Diagnosis not present

## 2017-11-03 DIAGNOSIS — D485 Neoplasm of uncertain behavior of skin: Secondary | ICD-10-CM | POA: Diagnosis not present

## 2017-11-03 DIAGNOSIS — L821 Other seborrheic keratosis: Secondary | ICD-10-CM | POA: Diagnosis not present

## 2017-11-14 DIAGNOSIS — M9902 Segmental and somatic dysfunction of thoracic region: Secondary | ICD-10-CM | POA: Diagnosis not present

## 2017-11-14 DIAGNOSIS — M5414 Radiculopathy, thoracic region: Secondary | ICD-10-CM | POA: Diagnosis not present

## 2017-11-14 DIAGNOSIS — M5416 Radiculopathy, lumbar region: Secondary | ICD-10-CM | POA: Diagnosis not present

## 2017-11-14 DIAGNOSIS — M9903 Segmental and somatic dysfunction of lumbar region: Secondary | ICD-10-CM | POA: Diagnosis not present

## 2017-11-14 DIAGNOSIS — M531 Cervicobrachial syndrome: Secondary | ICD-10-CM | POA: Diagnosis not present

## 2017-11-14 DIAGNOSIS — M9901 Segmental and somatic dysfunction of cervical region: Secondary | ICD-10-CM | POA: Diagnosis not present

## 2017-12-01 DIAGNOSIS — C44612 Basal cell carcinoma of skin of right upper limb, including shoulder: Secondary | ICD-10-CM | POA: Diagnosis not present

## 2017-12-18 DIAGNOSIS — R69 Illness, unspecified: Secondary | ICD-10-CM | POA: Diagnosis not present

## 2018-01-19 DIAGNOSIS — M9903 Segmental and somatic dysfunction of lumbar region: Secondary | ICD-10-CM | POA: Diagnosis not present

## 2018-01-19 DIAGNOSIS — M9901 Segmental and somatic dysfunction of cervical region: Secondary | ICD-10-CM | POA: Diagnosis not present

## 2018-01-19 DIAGNOSIS — M5414 Radiculopathy, thoracic region: Secondary | ICD-10-CM | POA: Diagnosis not present

## 2018-01-19 DIAGNOSIS — M531 Cervicobrachial syndrome: Secondary | ICD-10-CM | POA: Diagnosis not present

## 2018-01-19 DIAGNOSIS — M5416 Radiculopathy, lumbar region: Secondary | ICD-10-CM | POA: Diagnosis not present

## 2018-01-19 DIAGNOSIS — M9902 Segmental and somatic dysfunction of thoracic region: Secondary | ICD-10-CM | POA: Diagnosis not present

## 2018-02-01 DIAGNOSIS — H524 Presbyopia: Secondary | ICD-10-CM | POA: Diagnosis not present

## 2018-02-01 DIAGNOSIS — H52221 Regular astigmatism, right eye: Secondary | ICD-10-CM | POA: Diagnosis not present

## 2018-02-01 DIAGNOSIS — H5203 Hypermetropia, bilateral: Secondary | ICD-10-CM | POA: Diagnosis not present

## 2018-02-08 DIAGNOSIS — M5414 Radiculopathy, thoracic region: Secondary | ICD-10-CM | POA: Diagnosis not present

## 2018-02-08 DIAGNOSIS — M531 Cervicobrachial syndrome: Secondary | ICD-10-CM | POA: Diagnosis not present

## 2018-02-08 DIAGNOSIS — M9902 Segmental and somatic dysfunction of thoracic region: Secondary | ICD-10-CM | POA: Diagnosis not present

## 2018-02-08 DIAGNOSIS — M9903 Segmental and somatic dysfunction of lumbar region: Secondary | ICD-10-CM | POA: Diagnosis not present

## 2018-02-08 DIAGNOSIS — M5416 Radiculopathy, lumbar region: Secondary | ICD-10-CM | POA: Diagnosis not present

## 2018-02-08 DIAGNOSIS — M9901 Segmental and somatic dysfunction of cervical region: Secondary | ICD-10-CM | POA: Diagnosis not present

## 2018-02-15 ENCOUNTER — Other Ambulatory Visit: Payer: Self-pay | Admitting: Family Medicine

## 2018-02-15 ENCOUNTER — Encounter: Payer: Self-pay | Admitting: Family Medicine

## 2018-02-15 MED ORDER — AMOXICILLIN 500 MG PO CAPS: 500.0000 mg | ORAL_CAPSULE | Freq: Three times a day (TID) | ORAL | 0 refills | Status: DC

## 2018-03-23 DIAGNOSIS — M5414 Radiculopathy, thoracic region: Secondary | ICD-10-CM | POA: Diagnosis not present

## 2018-03-23 DIAGNOSIS — M9903 Segmental and somatic dysfunction of lumbar region: Secondary | ICD-10-CM | POA: Diagnosis not present

## 2018-03-23 DIAGNOSIS — M9902 Segmental and somatic dysfunction of thoracic region: Secondary | ICD-10-CM | POA: Diagnosis not present

## 2018-03-23 DIAGNOSIS — M5416 Radiculopathy, lumbar region: Secondary | ICD-10-CM | POA: Diagnosis not present

## 2018-03-23 DIAGNOSIS — M9901 Segmental and somatic dysfunction of cervical region: Secondary | ICD-10-CM | POA: Diagnosis not present

## 2018-03-23 DIAGNOSIS — M531 Cervicobrachial syndrome: Secondary | ICD-10-CM | POA: Diagnosis not present

## 2018-03-25 DIAGNOSIS — Z0100 Encounter for examination of eyes and vision without abnormal findings: Secondary | ICD-10-CM | POA: Diagnosis not present

## 2018-04-28 ENCOUNTER — Telehealth: Payer: Self-pay | Admitting: Family Medicine

## 2018-04-28 NOTE — Telephone Encounter (Signed)
Returned call to patient to schedule AWV. Patient did not answer. Will try to call patient back at a later time.

## 2018-05-02 DIAGNOSIS — R69 Illness, unspecified: Secondary | ICD-10-CM | POA: Diagnosis not present

## 2018-05-03 ENCOUNTER — Encounter: Payer: Self-pay | Admitting: Family Medicine

## 2018-05-11 DIAGNOSIS — M531 Cervicobrachial syndrome: Secondary | ICD-10-CM | POA: Diagnosis not present

## 2018-05-11 DIAGNOSIS — M9901 Segmental and somatic dysfunction of cervical region: Secondary | ICD-10-CM | POA: Diagnosis not present

## 2018-05-11 DIAGNOSIS — M9902 Segmental and somatic dysfunction of thoracic region: Secondary | ICD-10-CM | POA: Diagnosis not present

## 2018-05-11 DIAGNOSIS — M5414 Radiculopathy, thoracic region: Secondary | ICD-10-CM | POA: Diagnosis not present

## 2018-05-11 DIAGNOSIS — M5416 Radiculopathy, lumbar region: Secondary | ICD-10-CM | POA: Diagnosis not present

## 2018-05-11 DIAGNOSIS — M9903 Segmental and somatic dysfunction of lumbar region: Secondary | ICD-10-CM | POA: Diagnosis not present

## 2018-05-16 ENCOUNTER — Ambulatory Visit: Payer: Medicare HMO | Admitting: *Deleted

## 2018-06-26 ENCOUNTER — Ambulatory Visit: Payer: Medicare HMO | Admitting: *Deleted

## 2018-07-31 ENCOUNTER — Encounter: Payer: Self-pay | Admitting: Family Medicine

## 2018-08-02 ENCOUNTER — Other Ambulatory Visit: Payer: Self-pay | Admitting: Family Medicine

## 2018-08-02 DIAGNOSIS — Z9622 Myringotomy tube(s) status: Secondary | ICD-10-CM

## 2018-08-02 NOTE — Progress Notes (Unsigned)
Ref

## 2018-08-03 DIAGNOSIS — H6123 Impacted cerumen, bilateral: Secondary | ICD-10-CM | POA: Diagnosis not present

## 2018-08-04 DIAGNOSIS — H6123 Impacted cerumen, bilateral: Secondary | ICD-10-CM | POA: Diagnosis not present

## 2018-08-10 DIAGNOSIS — M9901 Segmental and somatic dysfunction of cervical region: Secondary | ICD-10-CM | POA: Diagnosis not present

## 2018-08-10 DIAGNOSIS — M5414 Radiculopathy, thoracic region: Secondary | ICD-10-CM | POA: Diagnosis not present

## 2018-08-10 DIAGNOSIS — M5416 Radiculopathy, lumbar region: Secondary | ICD-10-CM | POA: Diagnosis not present

## 2018-08-10 DIAGNOSIS — M9903 Segmental and somatic dysfunction of lumbar region: Secondary | ICD-10-CM | POA: Diagnosis not present

## 2018-08-10 DIAGNOSIS — M531 Cervicobrachial syndrome: Secondary | ICD-10-CM | POA: Diagnosis not present

## 2018-08-10 DIAGNOSIS — M9902 Segmental and somatic dysfunction of thoracic region: Secondary | ICD-10-CM | POA: Diagnosis not present

## 2018-09-04 NOTE — Progress Notes (Signed)
Virtual Visit via Video Note  I connected with patient on 09/05/18 at  8:00 AM EDT by a video enabled telemedicine application and verified that I am speaking with the correct person using two identifiers.   THIS ENCOUNTER IS A VIRTUAL VISIT DUE TO COVID-19 - PATIENT WAS NOT SEEN IN THE OFFICE. PATIENT HAS CONSENTED TO VIRTUAL VISIT / TELEMEDICINE VISIT   Location of patient: home  Location of provider: office  I discussed the limitations of evaluation and management by telemedicine and the availability of in person appointments. The patient expressed understanding and agreed to proceed.   Subjective:   Jacqueline Ray is a 68 y.o. female who presents for an Initial Medicare Annual Wellness Visit.  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  Review of Systems   No ROS.  Medicare Wellness Virtual Visit.  Visual/audio telehealth visit, UTA vital signs.   See social history for additional risk factors.  Cardiac Risk Factors include: advanced age (>50men, >68 women) Sleep patterns: no issues Home Safety/Smoke Alarms: Feels safe in home. Smoke alarms in place.  Lives with husband. 2 story home. No issue with stairs.   Female:   Pap-    2017   Mammo- ordered      Dexa scan- ordered    CCS- due 2024 per pt.    Objective:     Advanced Directives 09/05/2018 04/05/2016  Does Patient Have a Medical Advance Directive? No No  Would patient like information on creating a medical advance directive? No - Patient declined No - Patient declined    Current Medications (verified) Outpatient Encounter Medications as of 09/05/2018  Medication Sig  . BIOTIN PO Take by mouth daily.  Marland Kitchen CALCIUM PO Take by mouth daily.  . Cholecalciferol (VITAMIN D PO) Take by mouth daily.  . fluticasone (FLONASE) 50 MCG/ACT nasal spray Place 2 sprays into both nostrils daily.  . Multiple Vitamins-Minerals (MULTIVITAMIN PO) Take by mouth daily.  . Omega-3 Fatty Acids (OMEGA 3 PO) Take by mouth daily.  Marland Kitchen VITAMIN E PO Take  by mouth as needed.   . [DISCONTINUED] amoxicillin (AMOXIL) 500 MG capsule Take 1 capsule (500 mg total) by mouth 3 (three) times daily.   No facility-administered encounter medications on file as of 09/05/2018.     Allergies (verified) Codeine and Darvocet [propoxyphene n-acetaminophen]   History: Past Medical History:  Diagnosis Date  . Abnormal Pap smear    as of 2014 it has been over 20 yrs ago   . Acid reflux 04/17/2017  . BCC (basal cell carcinoma of skin) 04/17/2017  . H/O measles   . History of chicken pox   . Hyperlipidemia   . Menorrhagia    Past Surgical History:  Procedure Laterality Date  . BASAL CELL CARCINOMA EXCISION     right posterior leg  . CRYOTHERAPY     for abn pap. as of 2014 it has been over 20 yrs ago since done  . DILATION AND CURETTAGE OF UTERUS     for menorrhagia  . FOOT SURGERY Left 2006   bone spur  . MOLE REMOVAL  2011   rt thigh, negative  . TUBAL LIGATION  1981  . TYMPANOSTOMY TUBE PLACEMENT     Family History  Problem Relation Age of Onset  . Osteoporosis Sister   . Heart disease Sister 66       pacemaker s/p MI  . Diabetes Sister   . Cancer Sister        liver  .  Diabetes Brother   . Kidney disease Brother   . Cancer Brother        prostate  . Hypertension Mother   . Kidney disease Mother        dialysis, enlarged  . Heart disease Father   . Hypertension Father   . Cancer Paternal Grandmother    Social History   Socioeconomic History  . Marital status: Married    Spouse name: Not on file  . Number of children: Not on file  . Years of education: Not on file  . Highest education level: Not on file  Occupational History  . Not on file  Social Needs  . Financial resource strain: Not on file  . Food insecurity    Worry: Not on file    Inability: Not on file  . Transportation needs    Medical: Not on file    Non-medical: Not on file  Tobacco Use  . Smoking status: Never Smoker  . Smokeless tobacco: Never Used   Substance and Sexual Activity  . Alcohol use: Yes    Alcohol/week: 3.0 - 4.0 standard drinks    Types: 3 - 4 Glasses of wine per week    Comment: per week  . Drug use: No  . Sexual activity: Yes    Partners: Male    Birth control/protection: Surgical    Comment: BTL  Lifestyle  . Physical activity    Days per week: Not on file    Minutes per session: Not on file  . Stress: Not on file  Relationships  . Social Herbalist on phone: Not on file    Gets together: Not on file    Attends religious service: Not on file    Active member of club or organization: Not on file    Attends meetings of clubs or organizations: Not on file    Relationship status: Not on file  Other Topics Concern  . Not on file  Social History Narrative   Works with [employer] in [place]. Lives with husband, no pets. No dietary restrictions, exercises regularly works out 3 x a week. Wears a seat belt.    Tobacco Counseling Counseling given: Not Answered   Clinical Intake:  Pain : No/denies pain    Activities of Daily Living In your present state of health, do you have any difficulty performing the following activities: 09/05/2018  Hearing? N  Vision? N  Difficulty concentrating or making decisions? N  Walking or climbing stairs? N  Dressing or bathing? N  Doing errands, shopping? N  Preparing Food and eating ? N  Using the Toilet? N  In the past six months, have you accidently leaked urine? N  Do you have problems with loss of bowel control? N  Managing your Medications? N  Managing your Finances? N  Housekeeping or managing your Housekeeping? N  Some recent data might be hidden     Immunizations and Health Maintenance Immunization History  Administered Date(s) Administered  . Pneumococcal Conjugate-13 04/14/2017  . Tdap 12/31/2010   Health Maintenance Due  Topic Date Due  . COLONOSCOPY  03/02/2001  . DEXA SCAN  03/02/2016  . MAMMOGRAM  03/08/2018  . PNA vac Low Risk  Adult (2 of 2 - PPSV23) 04/14/2018    Patient Care Team: Mosie Lukes, MD as PCP - General (Family Medicine)  Indicate any recent Medical Services you may have received from other than [facility] providers in the past year (date may be  approximate).     Assessment:   This is a routine wellness examination for Hendricks. Physical assessment deferred to PCP.  Hearing/Vision screen Unable to assess. This visit is enabled though telemedicine due to Covid 19.   Dietary issues and exercise activities discussed: Current Exercise Habits: Home exercise routine, Type of exercise: walking;strength training/weights, Time (Minutes): 30, Frequency (Times/Week): 4, Weekly Exercise (Minutes/Week): 120, Intensity: Mild, Exercise limited by: None identified Diet (meal preparation, eat out, water intake, caffeinated beverages, dairy products, fruits and vegetables): in general, a "healthy" diet  , well balanced   Goals    . Maintain healthy active lifestyle.      Depression Screen PHQ 2/9 Scores 09/05/2018  PHQ - 2 Score 0    Fall Risk Fall Risk  09/05/2018  Falls in the past year? 0     Cognitive Function: Ad8 score reviewed for issues:  Issues making decisions:no  Less interest in hobbies / activities:no  Repeats questions, stories (family complaining):no  Trouble using ordinary gadgets (microwave, computer, phone):no  Forgets the month or year: no  Mismanaging finances: no  Remembering appts:no  Daily problems with thinking and/or memory:no Ad8 score is=0         Screening Tests Health Maintenance  Topic Date Due  . COLONOSCOPY  03/02/2001  . DEXA SCAN  03/02/2016  . MAMMOGRAM  03/08/2018  . PNA vac Low Risk Adult (2 of 2 - PPSV23) 04/14/2018  . INFLUENZA VACCINE  09/30/2018  . TETANUS/TDAP  12/30/2020  . Hepatitis C Screening  Completed       Plan:   See you next year!  Keep up the great work.  I have ordered your mammogram and bone density scan.    I have  personally reviewed and noted the following in the patient's chart:   . Medical and social history . Use of alcohol, tobacco or illicit drugs  . Current medications and supplements . Functional ability and status . Nutritional status . Physical activity . Advanced directives . List of other physicians . Hospitalizations, surgeries, and ER visits in previous 12 months . Vitals . Screenings to include cognitive, depression, and falls . Referrals and appointments  In addition, I have reviewed and discussed with patient certain preventive protocols, quality metrics, and best practice recommendations. A written personalized care plan for preventive services as well as general preventive health recommendations were provided to patient.     Naaman Plummer [place], [place]   09/05/2018

## 2018-09-05 ENCOUNTER — Other Ambulatory Visit: Payer: Self-pay

## 2018-09-05 ENCOUNTER — Encounter: Payer: Self-pay | Admitting: *Deleted

## 2018-09-05 ENCOUNTER — Ambulatory Visit (INDEPENDENT_AMBULATORY_CARE_PROVIDER_SITE_OTHER): Payer: Medicare HMO | Admitting: *Deleted

## 2018-09-05 DIAGNOSIS — Z1231 Encounter for screening mammogram for malignant neoplasm of breast: Secondary | ICD-10-CM | POA: Diagnosis not present

## 2018-09-05 DIAGNOSIS — Z780 Asymptomatic menopausal state: Secondary | ICD-10-CM

## 2018-09-05 DIAGNOSIS — Z0000 Encounter for general adult medical examination without abnormal findings: Secondary | ICD-10-CM

## 2018-09-05 NOTE — Patient Instructions (Signed)
See you next year!  Keep up the great work.  I have ordered your mammogram and bone density scan.    [name] , Thank you for taking time to come for your Medicare Wellness Visit. I appreciate your ongoing commitment to your health goals. Please review the following plan we discussed and let me know if I can assist you in the future.   These are the goals we discussed: Goals    . Maintain healthy active lifestyle.       This is a list of the screening recommended for you and due dates:  Health Maintenance  Topic Date Due  . Colon Cancer Screening  03/02/2001  . DEXA scan (bone density measurement)  03/02/2016  . Mammogram  03/08/2018  . Pneumonia vaccines (2 of 2 - PPSV23) 04/14/2018  . Flu Shot  09/30/2018  . Tetanus Vaccine  12/30/2020  .  Hepatitis C: One time screening is recommended by Center for Disease Control  (CDC) for  adults born from [birth year] through 1964.   Completed    Health Maintenance After Age 76 After age 62, you are at a higher risk for certain long-term diseases and infections as well as injuries from falls. Falls are a major cause of broken bones and head injuries in people who are older than age 51. Getting regular preventive care can help to keep you healthy and well. Preventive care includes getting regular testing and making lifestyle changes as recommended by your health care provider. Talk with your health care provider about:  Which screenings and tests you should have. A screening is a test that checks for a disease when you have no symptoms.  A diet and exercise plan that is right for you. What should I know about screenings and tests to prevent falls? Screening and testing are the best ways to find a health problem early. Early diagnosis and treatment give you the best chance of managing medical conditions that are common after age 77. Certain conditions and lifestyle choices may make you more likely to have a fall. Your health care provider may  recommend:  Regular vision checks. Poor vision and conditions such as cataracts can make you more likely to have a fall. If you wear glasses, make sure to get your prescription updated if your vision changes.  Medicine review. Work with your health care provider to regularly review all of the medicines you are taking, including over-the-counter medicines. Ask your health care provider about any side effects that may make you more likely to have a fall. Tell your health care provider if any medicines that you take make you feel dizzy or sleepy.  Osteoporosis screening. Osteoporosis is a condition that causes the bones to get weaker. This can make the bones weak and cause them to break more easily.  Blood pressure screening. Blood pressure changes and medicines to control blood pressure can make you feel dizzy.  Strength and balance checks. Your health care provider may recommend certain tests to check your strength and balance while standing, walking, or changing positions.  Foot health exam. Foot pain and numbness, as well as not wearing proper footwear, can make you more likely to have a fall.  Depression screening. You may be more likely to have a fall if you have a fear of falling, feel emotionally low, or feel unable to do activities that you used to do.  Alcohol use screening. Using too much alcohol can affect your balance and may make you more likely  to have a fall. What actions can I take to lower my risk of falls? General instructions  Talk with your health care provider about your risks for falling. Tell your health care provider if: ? You fall. Be sure to tell your health care provider about all falls, even ones that seem minor. ? You feel dizzy, sleepy, or off-balance.  Take over-the-counter and prescription medicines only as told by your health care provider. These include any supplements.  Eat a healthy diet and maintain a healthy weight. A healthy diet includes low-fat dairy  products, low-fat (lean) meats, and fiber from whole grains, beans, and lots of fruits and vegetables. Home safety  Remove any tripping hazards, such as rugs, cords, and clutter.  Install safety equipment such as grab bars in bathrooms and safety rails on stairs.  Keep rooms and walkways well-lit. Activity   Follow a regular exercise program to stay fit. This will help you maintain your balance. Ask your health care provider what types of exercise are appropriate for you.  If you need a cane or walker, use it as recommended by your health care provider.  Wear supportive shoes that have nonskid soles. Lifestyle  Do not drink alcohol if your health care provider tells you not to drink.  If you drink alcohol, limit how much you have: ? 0-1 drink a day for women. ? 0-2 drinks a day for men.  Be aware of how much alcohol is in your drink. In the [place] one drink equals one typical bottle of beer (12 oz), one-half glass of wine (5 oz), or one shot of hard liquor (1 oz).  Do not use any products that contain nicotine or tobacco, such as cigarettes and e-cigarettes. If you need help quitting, ask your health care provider. Summary  Having a healthy lifestyle and getting preventive care can help to protect your health and wellness after age 46.  Screening and testing are the best way to find a health problem early and help you avoid having a fall. Early diagnosis and treatment give you the best chance for managing medical conditions that are more common for people who are older than age 46.  Falls are a major cause of broken bones and head injuries in people who are older than age 37. Take precautions to prevent a fall at home.  Work with your health care provider to learn what changes you can make to improve your health and wellness and to prevent falls. This information is not intended to replace advice given to you by your health care provider. Make sure you discuss any questions you  have with your health care provider. Document Released: 12/29/2016 Document Revised: 06/08/2018 Document Reviewed: 12/29/2016 Elsevier Patient Education  2020 [employer].

## 2018-09-19 ENCOUNTER — Other Ambulatory Visit: Payer: Self-pay

## 2018-09-19 ENCOUNTER — Encounter (HOSPITAL_BASED_OUTPATIENT_CLINIC_OR_DEPARTMENT_OTHER): Payer: Self-pay

## 2018-09-19 ENCOUNTER — Encounter: Payer: Self-pay | Admitting: Family

## 2018-09-19 ENCOUNTER — Ambulatory Visit (HOSPITAL_BASED_OUTPATIENT_CLINIC_OR_DEPARTMENT_OTHER)
Admission: RE | Admit: 2018-09-19 | Discharge: 2018-09-19 | Disposition: A | Discharge status: Home / Self Care | Payer: Medicare HMO | Source: Ambulatory Visit | Attending: Family | Admitting: Family

## 2018-09-19 ENCOUNTER — Telehealth: Payer: Self-pay | Admitting: Family

## 2018-09-19 DIAGNOSIS — Z1231 Encounter for screening mammogram for malignant neoplasm of breast: Secondary | ICD-10-CM

## 2018-09-19 DIAGNOSIS — Z780 Asymptomatic menopausal state: Secondary | ICD-10-CM | POA: Diagnosis not present

## 2018-09-19 DIAGNOSIS — M810 Age-related osteoporosis without current pathological fracture: Secondary | ICD-10-CM

## 2018-09-19 DIAGNOSIS — M85852 Other specified disorders of bone density and structure, left thigh: Secondary | ICD-10-CM | POA: Diagnosis not present

## 2018-09-19 HISTORY — DX: Other signs and symptoms in breast: N64.59

## 2018-09-19 HISTORY — DX: Age-related osteoporosis without current pathological fracture: M81.0

## 2018-09-19 MED ORDER — ALENDRONATE SODIUM 70 MG PO TABS: 70.0000 mg | ORAL_TABLET | ORAL | 11 refills | Status: DC

## 2018-09-19 NOTE — Telephone Encounter (Signed)
Patient advised of results and to start taking fosamax, instructions on how to take it given to patient. She has to reschedule an appointment with Dr. Charlett Blake and she will get vitamin D done that same day.

## 2018-09-19 NOTE — Telephone Encounter (Signed)
Please contact pt and let her know that her bone density shows severe osteoporosis. I would like her to add fosamax once weekly. Sit upright for 90 minutes after taking. Add caltrate 600mg  + D twice daily.   Ensure regular weight bearing exercise such as walking.   Schedule a lab visit for vit D level.

## 2018-09-21 ENCOUNTER — Ambulatory Visit: Payer: Medicare HMO | Admitting: Family Medicine

## 2018-09-22 ENCOUNTER — Other Ambulatory Visit: Payer: Self-pay

## 2018-09-22 ENCOUNTER — Encounter: Payer: Self-pay | Admitting: Family Medicine

## 2018-09-22 ENCOUNTER — Ambulatory Visit (INDEPENDENT_AMBULATORY_CARE_PROVIDER_SITE_OTHER): Payer: Medicare HMO | Admitting: Family Medicine

## 2018-09-22 VITALS — BP 148/84 | HR 64 | Temp 98.2°F | Resp 18 | Wt 134.8 lb

## 2018-09-22 DIAGNOSIS — I10 Essential (primary) hypertension: Secondary | ICD-10-CM | POA: Insufficient documentation

## 2018-09-22 DIAGNOSIS — M810 Age-related osteoporosis without current pathological fracture: Secondary | ICD-10-CM

## 2018-09-22 DIAGNOSIS — E782 Mixed hyperlipidemia: Secondary | ICD-10-CM

## 2018-09-22 DIAGNOSIS — Z23 Encounter for immunization: Secondary | ICD-10-CM

## 2018-09-22 DIAGNOSIS — Z0000 Encounter for general adult medical examination without abnormal findings: Secondary | ICD-10-CM

## 2018-09-22 HISTORY — DX: Essential (primary) hypertension: I10

## 2018-09-22 LAB — COMPREHENSIVE METABOLIC PANEL
ALT: 19 U/L (ref 0–35)
AST: 25 U/L (ref 0–37)
Albumin: 4.5 g/dL (ref 3.5–5.2)
Alkaline Phosphatase: 68 U/L (ref 39–117)
BUN: 15 mg/dL (ref 6–23)
CO2: 31 mEq/L (ref 19–32)
Calcium: 9.5 mg/dL (ref 8.4–10.5)
Chloride: 104 mEq/L (ref 96–112)
Creatinine, Ser: 0.79 mg/dL (ref 0.40–1.20)
GFR: 72.47 mL/min (ref >60.00)
Glucose, Bld: 87 mg/dL (ref 70–99)
Potassium: 4 mEq/L (ref 3.5–5.1)
Sodium: 142 mEq/L (ref 135–145)
Total Bilirubin: 0.5 mg/dL (ref 0.2–1.2)
Total Protein: 6.6 g/dL (ref 6.0–8.3)

## 2018-09-22 LAB — CBC
HCT: 44.8 % (ref 36.0–46.0)
Hemoglobin: 14.9 g/dL (ref 12.0–15.0)
MCHC: 33.2 g/dL (ref 30.0–36.0)
MCV: 92.9 fl (ref 78.0–100.0)
Platelets: 203 10*3/uL (ref 150.0–400.0)
RBC: 4.83 Mil/uL (ref 3.87–5.11)
RDW: 13.8 % (ref 11.5–15.5)
WBC: 5 10*3/uL (ref 4.0–10.5)

## 2018-09-22 LAB — LIPID PANEL
Cholesterol: 244 mg/dL — ABNORMAL HIGH (ref 0–200)
HDL: 99.1 mg/dL (ref >39.00)
LDL Cholesterol: 131 mg/dL — ABNORMAL HIGH (ref 0–99)
NonHDL: 144.64
Total CHOL/HDL Ratio: 2
Triglycerides: 67 mg/dL (ref 0.0–149.0)
VLDL: 13.4 mg/dL (ref 0.0–40.0)

## 2018-09-22 LAB — VITAMIN D 25 HYDROXY (VIT D DEFICIENCY, FRACTURES): VITD: 32.79 ng/mL (ref 30.00–100.00)

## 2018-09-22 LAB — TSH: TSH: 1.39 u[IU]/mL (ref 0.35–4.50)

## 2018-09-22 NOTE — Patient Instructions (Addendum)
Omron blood pressure cuff, upper arm Pulse oximeter    Weekly check vitals  Vitamin C 500 to 1000 mg twice daily Zinc 30-50 mg once daily  Shingrix is the new shingles shot, 2 shots over 2-6 months at pharmacy, call insurance and confirm coverage and where they want you to get it. Document   Preventive Care 65 Years and Older, Female Preventive care refers to lifestyle choices and visits with your health care provider that can promote health and wellness. This includes:  A yearly physical exam. This is also called an annual well check.  Regular dental and eye exams.  Immunizations.  Screening for certain conditions.  Healthy lifestyle choices, such as diet and exercise. What can I expect for my preventive care visit?  Physical exam Your health care provider will check:  Height and weight. These may be used to calculate body mass index (BMI), which is a measurement that tells if you are at a healthy weight.  Heart rate and blood pressure.  Your skin for abnormal spots. Counseling Your health care provider may ask you questions about:  Alcohol, tobacco, and drug use.  Emotional well-being.  Home and relationship well-being.  Sexual activity.  Eating habits.  History of falls.  Memory and ability to understand (cognition).  Work and work Statistician.  Pregnancy and menstrual history. What immunizations do I need?  Influenza (flu) vaccine  This is recommended every year. Tetanus, diphtheria, and pertussis (Tdap) vaccine  You may need a Td booster every 10 years. Varicella (chickenpox) vaccine  You may need this vaccine if you have not already been vaccinated. Zoster (shingles) vaccine  You may need this after age 29. Pneumococcal conjugate (PCV13) vaccine  One dose is recommended after age 53. Pneumococcal polysaccharide (PPSV23) vaccine  One dose is recommended after age 47. Measles, mumps, and rubella (MMR) vaccine  You may need at least one  dose of MMR if you were born in 1957 or later. You may also need a second dose. Meningococcal conjugate (MenACWY) vaccine  You may need this if you have certain conditions. Hepatitis A vaccine  You may need this if you have certain conditions or if you travel or work in places where you may be exposed to hepatitis A. Hepatitis B vaccine  You may need this if you have certain conditions or if you travel or work in places where you may be exposed to hepatitis B. Haemophilus influenzae type b (Hib) vaccine  You may need this if you have certain conditions. You may receive vaccines as individual doses or as more than one vaccine together in one shot (combination vaccines). Talk with your health care provider about the risks and benefits of combination vaccines. What tests do I need? Blood tests  Lipid and cholesterol levels. These may be checked every 5 years, or more frequently depending on your overall health.  Hepatitis C test.  Hepatitis B test. Screening  Lung cancer screening. You may have this screening every year starting at age 18 if you have a 30-pack-year history of smoking and currently smoke or have quit within the past 15 years.  Colorectal cancer screening. All adults should have this screening starting at age 58 and continuing until age 19. Your health care provider may recommend screening at age 55 if you are at increased risk. You will have tests every 1-10 years, depending on your results and the type of screening test.  Diabetes screening. This is done by checking your blood sugar (glucose) after you  have not eaten for a while (fasting). You may have this done every 1-3 years.  Mammogram. This may be done every 1-2 years. Talk with your health care provider about how often you should have regular mammograms.  BRCA-related cancer screening. This may be done if you have a family history of breast, ovarian, tubal, or peritoneal cancers. Other tests  Sexually  transmitted disease (STD) testing.  Bone density scan. This is done to screen for osteoporosis. You may have this done starting at age 68. Follow these instructions at home: Eating and drinking  Eat a diet that includes fresh fruits and vegetables, whole grains, lean protein, and low-fat dairy products. Limit your intake of foods with high amounts of sugar, saturated fats, and salt.  Take vitamin and mineral supplements as recommended by your health care provider.  Do not drink alcohol if your health care provider tells you not to drink.  If you drink alcohol: ? Limit how much you have to 0-1 drink a day. ? Be aware of how much alcohol is in your drink. In the [place] one drink equals one 12 oz bottle of beer (355 mL), one 5 oz glass of wine (148 mL), or one 1 oz glass of hard liquor (44 mL). Lifestyle  Take daily care of your teeth and gums.  Stay active. Exercise for at least 30 minutes on 5 or more days each week.  Do not use any products that contain nicotine or tobacco, such as cigarettes, e-cigarettes, and chewing tobacco. If you need help quitting, ask your health care provider.  If you are sexually active, practice safe sex. Use a condom or other form of protection in order to prevent STIs (sexually transmitted infections).  Talk with your health care provider about taking a low-dose aspirin or statin. What's next?  Go to your health care provider once a year for a well check visit.  Ask your health care provider how often you should have your eyes and teeth checked.  Stay up to date on all vaccines. This information is not intended to replace advice given to you by your health care provider. Make sure you discuss any questions you have with your health care provider. Document Released: 03/14/2015 Document Revised: 02/09/2018 Document Reviewed: 02/09/2018 Elsevier Patient Education  2020 [employer].

## 2018-09-22 NOTE — Assessment & Plan Note (Signed)
Encouraged heart healthy diet, increase exercise, avoid trans fats, consider a krill oil cap daily 

## 2018-09-22 NOTE — Assessment & Plan Note (Signed)
Patient encouraged to maintain heart healthy diet, regular exercise, adequate sleep. Consider daily probiotics. Take medications as prescribed. MGM and bone density reviewed. Labs ordered

## 2018-09-22 NOTE — Assessment & Plan Note (Signed)
Improved on recheck she is encouraged to get an Omron BP cuff and check it weekly reevaluate in 3 months unless worsens

## 2018-09-22 NOTE — Addendum Note (Signed)
Addended by: Magdalene Molly A on: 09/22/2018 01:15 PM   Modules accepted: Orders

## 2018-09-22 NOTE — Assessment & Plan Note (Signed)
Newly diagnosed by her GYN and awaiting delivery of Fosamax. Check vitamin D today

## 2018-09-22 NOTE — Progress Notes (Signed)
Subjective:    Patient ID: Jacqueline Ray, female    DOB: [birth year], 68 y.o.   MRN: [id number]  No chief complaint on file.   HPI Patient is in today for annual preventative exam and follow up on chronic concerns including hyperlipidemia, osteoporosis and more. No recent febrile illness or hospitalizations. She has been seen by Dr Lucia Gaskins of ENT who confirmed her tympanostomy tube had fallen out and removed it. She has done well the 2 months since then and just has an occasional sense of fullness in the ear. No pain or discharge. . She is maintaining social distancing and masking well. Is observing quarantine for the most part. Denies CP/palp/SOB/HA/congestion/fevers/GI or GU c/o. Taking meds as prescribed  Past Medical History:  Diagnosis Date  . Abnormal Pap smear    as of 2014 it has been over 20 yrs ago   . Acid reflux 04/17/2017  . BCC (basal cell carcinoma of skin) 04/17/2017  . H/O measles   . High blood pressure 09/22/2018  . History of chicken pox   . Hyperlipidemia   . Inverted nipple   . Menorrhagia   . Osteoporosis 09/19/2018    Past Surgical History:  Procedure Laterality Date  . BASAL CELL CARCINOMA EXCISION     right posterior leg  . BREAST BIOPSY Right   . CRYOTHERAPY     for abn pap. as of 2014 it has been over 20 yrs ago since done  . DILATION AND CURETTAGE OF UTERUS     for menorrhagia  . FOOT SURGERY Left 2006   bone spur  . MOLE REMOVAL  2011   rt thigh, negative  . TUBAL LIGATION  1981  . TYMPANOSTOMY TUBE PLACEMENT      Family History  Problem Relation Age of Onset  . Osteoporosis Sister   . Heart disease Sister 57       pacemaker s/p MI  . Diabetes Sister   . Cancer Sister        liver  . Diabetes Brother   . Kidney disease Brother   . Cancer Brother        prostate  . Hypertension Mother   . Kidney disease Mother        dialysis, enlarged  . Heart disease Father   . Hypertension Father   . Cancer Paternal Grandmother     Social History    Socioeconomic History  . Marital status: Married    Spouse name: Not on file  . Number of children: Not on file  . Years of education: Not on file  . Highest education level: Not on file  Occupational History  . Not on file  Social Needs  . Financial resource strain: Not on file  . Food insecurity    Worry: Not on file    Inability: Not on file  . Transportation needs    Medical: Not on file    Non-medical: Not on file  Tobacco Use  . Smoking status: Never Smoker  . Smokeless tobacco: Never Used  Substance and Sexual Activity  . Alcohol use: Yes    Alcohol/week: 3.0 - 4.0 standard drinks    Types: 3 - 4 Glasses of wine per week    Comment: per week  . Drug use: No  . Sexual activity: Yes    Partners: Male    Birth control/protection: Surgical    Comment: BTL  Lifestyle  . Physical activity    Days per week: Not on file  Minutes per session: Not on file  . Stress: Not on file  Relationships  . Social Herbalist on phone: Not on file    Gets together: Not on file    Attends religious service: Not on file    Active member of club or organization: Not on file    Attends meetings of clubs or organizations: Not on file    Relationship status: Not on file  . Intimate partner violence    Fear of current or ex partner: Not on file    Emotionally abused: Not on file    Physically abused: Not on file    Forced sexual activity: Not on file  Other Topics Concern  . Not on file  Social History Narrative   Works with [employer] in [place]. Lives with husband, no pets. No dietary restrictions, exercises regularly works out 3 x a week. Wears a seat belt.    Outpatient Medications Prior to Visit  Medication Sig Dispense Refill  . alendronate (FOSAMAX) 70 MG tablet Take 1 tablet (70 mg total) by mouth every 7 (seven) days. Take with a full glass of water on an empty stomach. 4 tablet 11  . BIOTIN PO Take by mouth daily.    Marland Kitchen CALCIUM PO Take by mouth daily.    .  Cholecalciferol (VITAMIN D PO) Take by mouth daily.    . fluticasone (FLONASE) 50 MCG/ACT nasal spray Place 2 sprays into both nostrils daily. 16 g 3  . Multiple Vitamins-Minerals (MULTIVITAMIN PO) Take by mouth daily.    . Omega-3 Fatty Acids (OMEGA 3 PO) Take by mouth daily.    Marland Kitchen VITAMIN E PO Take by mouth as needed.      No facility-administered medications prior to visit.     Allergies  Allergen Reactions  . Codeine   . Darvocet [Propoxyphene N-Acetaminophen] Nausea And Vomiting    Review of Systems  Constitutional: Negative for chills, fever and malaise/fatigue.  HENT: Negative for congestion, ear pain, hearing loss and tinnitus.   Eyes: Negative for discharge.  Respiratory: Negative for cough, sputum production and shortness of breath.   Cardiovascular: Negative for chest pain, palpitations and leg swelling.  Gastrointestinal: Negative for abdominal pain, blood in stool, constipation, diarrhea, heartburn, nausea and vomiting.  Genitourinary: Negative for dysuria, frequency, hematuria and urgency.  Musculoskeletal: Negative for back pain, falls and myalgias.  Skin: Negative for rash.  Neurological: Negative for dizziness, sensory change, loss of consciousness, weakness and headaches.  Endo/Heme/Allergies: Negative for environmental allergies. Does not bruise/bleed easily.  Psychiatric/Behavioral: Negative for depression and suicidal ideas. The patient is not nervous/anxious and does not have insomnia.        Objective:    Physical Exam Constitutional:      General: She is not in acute distress.    Appearance: She is well-developed.  HENT:     Head: Normocephalic and atraumatic.  Eyes:     Conjunctiva/sclera: Conjunctivae normal.  Neck:     Musculoskeletal: Neck supple.     Thyroid: No thyromegaly.  Cardiovascular:     Rate and Rhythm: Normal rate and regular rhythm.     Heart sounds: Normal heart sounds. No murmur.  Pulmonary:     Effort: Pulmonary effort is  normal. No respiratory distress.     Breath sounds: Normal breath sounds.  Abdominal:     General: Bowel sounds are normal. There is no distension.     Palpations: Abdomen is soft. There is no mass.  Tenderness: There is no abdominal tenderness.  Lymphadenopathy:     Cervical: No cervical adenopathy.  Skin:    General: Skin is warm and dry.  Neurological:     Mental Status: She is alert and oriented to person, place, and time.  Psychiatric:        Behavior: Behavior normal.     BP (!) 148/84   Pulse 64   Temp 98.2 F (36.8 C) (Oral)   Resp 18   Wt 134 lb 12.8 oz (61.1 kg)   LMP 03/01/1997   SpO2 97%   BMI 24.66 kg/m  Wt Readings from Last 3 Encounters:  09/22/18 134 lb 12.8 oz (61.1 kg)  04/14/17 116 lb 9.6 oz (52.9 kg)  05/06/16 117 lb (53.1 kg)    Diabetic Foot Exam - Simple   No data filed     Lab Results  Component Value Date   WBC 6.0 04/14/2017   HGB 15.0 04/14/2017   HCT 45.2 04/14/2017   PLT 199.0 04/14/2017   GLUCOSE 94 04/14/2017   CHOL 208 (H) 04/14/2017   TRIG 54.0 04/14/2017   HDL 85.40 04/14/2017   LDLCALC 112 (H) 04/14/2017   ALT 17 04/14/2017   AST 23 04/14/2017   NA 142 04/14/2017   K 3.9 04/14/2017   CL 106 04/14/2017   CREATININE 0.81 04/14/2017   BUN 13 04/14/2017   CO2 32 04/14/2017   TSH 1.23 04/14/2017    Lab Results  Component Value Date   TSH 1.23 04/14/2017   Lab Results  Component Value Date   WBC 6.0 04/14/2017   HGB 15.0 04/14/2017   HCT 45.2 04/14/2017   MCV 92.3 04/14/2017   PLT 199.0 04/14/2017   Lab Results  Component Value Date   NA 142 04/14/2017   K 3.9 04/14/2017   CO2 32 04/14/2017   GLUCOSE 94 04/14/2017   BUN 13 04/14/2017   CREATININE 0.81 04/14/2017   BILITOT 0.5 04/14/2017   ALKPHOS 57 04/14/2017   AST 23 04/14/2017   ALT 17 04/14/2017   PROT 6.8 04/14/2017   ALBUMIN 4.1 04/14/2017   CALCIUM 9.1 04/14/2017   ANIONGAP 8 04/05/2016   GFR 75.16 04/14/2017   Lab Results  Component Value  Date   CHOL 208 (H) 04/14/2017   Lab Results  Component Value Date   HDL 85.40 04/14/2017   Lab Results  Component Value Date   LDLCALC 112 (H) 04/14/2017   Lab Results  Component Value Date   TRIG 54.0 04/14/2017   Lab Results  Component Value Date   CHOLHDL 2 04/14/2017   No results found for: HGBA1C     Assessment & Plan:   Problem List Items Addressed This Visit    Hyperlipidemia - Primary    Encouraged heart healthy diet, increase exercise, avoid trans fats, consider a krill oil cap daily      Relevant Orders   Lipid panel   Preventative health care    Patient encouraged to maintain heart healthy diet, regular exercise, adequate sleep. Consider daily probiotics. Take medications as prescribed. MGM and bone density reviewed. Labs ordered      Relevant Orders   CBC   Comprehensive metabolic panel   Lipid panel   TSH   Osteoporosis    Newly diagnosed by her GYN and awaiting delivery of Fosamax. Check vitamin D today      Relevant Orders   VITAMIN D 25 Hydroxy (Vit-D Deficiency, Fractures)   High blood pressure  Improved on recheck she is encouraged to get an Omron BP cuff and check it weekly reevaluate in 3 months unless worsens      Relevant Orders   CBC   Comprehensive metabolic panel   TSH      I am having Jacqueline Ray maintain her Omega-3 Fatty Acids (OMEGA 3 PO), VITAMIN E PO, CALCIUM PO, BIOTIN PO, Multiple Vitamins-Minerals (MULTIVITAMIN PO), Cholecalciferol (VITAMIN D PO), fluticasone, and alendronate.  No orders of the defined types were placed in this encounter.    Penni Homans, MD

## 2018-09-23 ENCOUNTER — Other Ambulatory Visit: Payer: Self-pay | Admitting: Medical

## 2018-09-23 ENCOUNTER — Encounter: Payer: Self-pay | Admitting: Family Medicine

## 2018-09-25 MED ORDER — FLUTICASONE PROPIONATE 50 MCG/ACT NA SUSP: 2.0000 | Freq: Every day | NASAL | 3 refills | Status: DC

## 2018-09-29 DIAGNOSIS — H6521 Chronic serous otitis media, right ear: Secondary | ICD-10-CM | POA: Diagnosis not present

## 2018-10-05 DIAGNOSIS — M9902 Segmental and somatic dysfunction of thoracic region: Secondary | ICD-10-CM | POA: Diagnosis not present

## 2018-10-05 DIAGNOSIS — M531 Cervicobrachial syndrome: Secondary | ICD-10-CM | POA: Diagnosis not present

## 2018-10-05 DIAGNOSIS — M5414 Radiculopathy, thoracic region: Secondary | ICD-10-CM | POA: Diagnosis not present

## 2018-10-05 DIAGNOSIS — M9901 Segmental and somatic dysfunction of cervical region: Secondary | ICD-10-CM | POA: Diagnosis not present

## 2018-10-05 DIAGNOSIS — M5416 Radiculopathy, lumbar region: Secondary | ICD-10-CM | POA: Diagnosis not present

## 2018-10-05 DIAGNOSIS — M9903 Segmental and somatic dysfunction of lumbar region: Secondary | ICD-10-CM | POA: Diagnosis not present

## 2018-10-24 ENCOUNTER — Encounter: Payer: Self-pay | Admitting: Family Medicine

## 2018-11-16 DIAGNOSIS — R69 Illness, unspecified: Secondary | ICD-10-CM | POA: Diagnosis not present

## 2018-11-18 ENCOUNTER — Ambulatory Visit (INDEPENDENT_AMBULATORY_CARE_PROVIDER_SITE_OTHER): Payer: Medicare HMO

## 2018-11-18 DIAGNOSIS — Z23 Encounter for immunization: Secondary | ICD-10-CM

## 2018-11-30 DIAGNOSIS — M5414 Radiculopathy, thoracic region: Secondary | ICD-10-CM | POA: Diagnosis not present

## 2018-11-30 DIAGNOSIS — M9901 Segmental and somatic dysfunction of cervical region: Secondary | ICD-10-CM | POA: Diagnosis not present

## 2018-11-30 DIAGNOSIS — M5416 Radiculopathy, lumbar region: Secondary | ICD-10-CM | POA: Diagnosis not present

## 2018-11-30 DIAGNOSIS — M9902 Segmental and somatic dysfunction of thoracic region: Secondary | ICD-10-CM | POA: Diagnosis not present

## 2018-11-30 DIAGNOSIS — M531 Cervicobrachial syndrome: Secondary | ICD-10-CM | POA: Diagnosis not present

## 2018-11-30 DIAGNOSIS — M9903 Segmental and somatic dysfunction of lumbar region: Secondary | ICD-10-CM | POA: Diagnosis not present

## 2018-12-29 ENCOUNTER — Ambulatory Visit: Payer: Medicare HMO | Admitting: Family Medicine

## 2019-01-05 ENCOUNTER — Ambulatory Visit: Payer: Medicare HMO | Admitting: Family Medicine

## 2019-01-18 DIAGNOSIS — M5416 Radiculopathy, lumbar region: Secondary | ICD-10-CM | POA: Diagnosis not present

## 2019-01-18 DIAGNOSIS — M9901 Segmental and somatic dysfunction of cervical region: Secondary | ICD-10-CM | POA: Diagnosis not present

## 2019-01-18 DIAGNOSIS — M9903 Segmental and somatic dysfunction of lumbar region: Secondary | ICD-10-CM | POA: Diagnosis not present

## 2019-01-18 DIAGNOSIS — M9902 Segmental and somatic dysfunction of thoracic region: Secondary | ICD-10-CM | POA: Diagnosis not present

## 2019-01-18 DIAGNOSIS — M5414 Radiculopathy, thoracic region: Secondary | ICD-10-CM | POA: Diagnosis not present

## 2019-01-18 DIAGNOSIS — M531 Cervicobrachial syndrome: Secondary | ICD-10-CM | POA: Diagnosis not present

## 2019-02-04 IMAGING — NM NM MISC PROCEDURE
6 series · 36 of 36 positions shown · non-contrast
Comparison: none

[Series 1: wbr_r-proj_st rest · 6.51mm/px · 6 of 64 frames shown]
[frame 6/64]
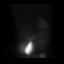
[frame 16/64]
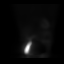
[frame 27/64]
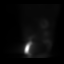
[frame 38/64]
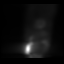
[frame 48/64]
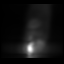
[frame 59/64]
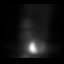

[Series 1: rest · 6.51mm/px · 6 of 64 frames shown]
[frame 6/64]
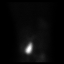
[frame 16/64]
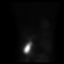
[frame 27/64]
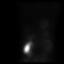
[frame 38/64]
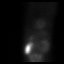
[frame 48/64]
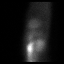
[frame 59/64]
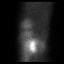

[Series 2: wbr_s-proj_st stress · 6.51mm/px · 6 of 64 frames shown (1 of 2)]
[frame 6/64]
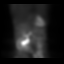
[frame 16/64]
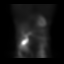
[frame 27/64]
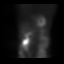
[frame 38/64]
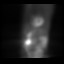
[frame 48/64]
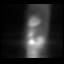
[frame 59/64]
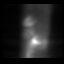

[Series 2: stress · 6.51mm/px · 6 of 64 frames shown (1 of 2)]
[frame 6/64]
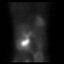
[frame 16/64]
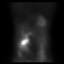
[frame 27/64]
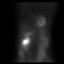
[frame 38/64]
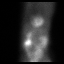
[frame 48/64]
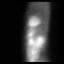
[frame 59/64]
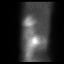

[Series 2: wbr_s-proj_st stress · 6.51mm/px · 6 of 512 frames shown (2 of 2)]
[frame 43/512]
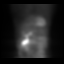
[frame 128/512]
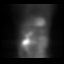
[frame 214/512]
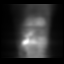
[frame 299/512]
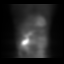
[frame 384/512]
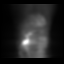
[frame 470/512]
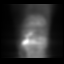

[Series 2: stress · 6.51mm/px · 6 of 512 frames shown (2 of 2)]
[frame 43/512]
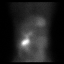
[frame 128/512]
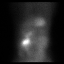
[frame 214/512]
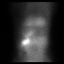
[frame 299/512]
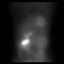
[frame 384/512]
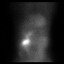
[frame 470/512]
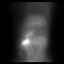

[36 of 36 positions shown; findings below may reference images not displayed]

Canned report from images found in remote index.

Refer to host system for actual result text.

## 2019-02-20 DIAGNOSIS — R69 Illness, unspecified: Secondary | ICD-10-CM | POA: Diagnosis not present

## 2019-03-15 DIAGNOSIS — M531 Cervicobrachial syndrome: Secondary | ICD-10-CM | POA: Diagnosis not present

## 2019-03-15 DIAGNOSIS — M5416 Radiculopathy, lumbar region: Secondary | ICD-10-CM | POA: Diagnosis not present

## 2019-03-15 DIAGNOSIS — M9903 Segmental and somatic dysfunction of lumbar region: Secondary | ICD-10-CM | POA: Diagnosis not present

## 2019-03-15 DIAGNOSIS — M5414 Radiculopathy, thoracic region: Secondary | ICD-10-CM | POA: Diagnosis not present

## 2019-03-15 DIAGNOSIS — M9901 Segmental and somatic dysfunction of cervical region: Secondary | ICD-10-CM | POA: Diagnosis not present

## 2019-03-15 DIAGNOSIS — M9902 Segmental and somatic dysfunction of thoracic region: Secondary | ICD-10-CM | POA: Diagnosis not present

## 2019-04-11 DIAGNOSIS — R69 Illness, unspecified: Secondary | ICD-10-CM | POA: Diagnosis not present

## 2019-05-14 DIAGNOSIS — H5203 Hypermetropia, bilateral: Secondary | ICD-10-CM | POA: Diagnosis not present

## 2019-05-18 DIAGNOSIS — M9901 Segmental and somatic dysfunction of cervical region: Secondary | ICD-10-CM | POA: Diagnosis not present

## 2019-05-18 DIAGNOSIS — M9903 Segmental and somatic dysfunction of lumbar region: Secondary | ICD-10-CM | POA: Diagnosis not present

## 2019-05-18 DIAGNOSIS — M9902 Segmental and somatic dysfunction of thoracic region: Secondary | ICD-10-CM | POA: Diagnosis not present

## 2019-05-18 DIAGNOSIS — M5414 Radiculopathy, thoracic region: Secondary | ICD-10-CM | POA: Diagnosis not present

## 2019-05-18 DIAGNOSIS — M531 Cervicobrachial syndrome: Secondary | ICD-10-CM | POA: Diagnosis not present

## 2019-05-18 DIAGNOSIS — M5416 Radiculopathy, lumbar region: Secondary | ICD-10-CM | POA: Diagnosis not present

## 2019-05-21 DIAGNOSIS — Z0100 Encounter for examination of eyes and vision without abnormal findings: Secondary | ICD-10-CM | POA: Diagnosis not present

## 2019-05-24 DIAGNOSIS — L578 Other skin changes due to chronic exposure to nonionizing radiation: Secondary | ICD-10-CM | POA: Diagnosis not present

## 2019-05-24 DIAGNOSIS — D1801 Hemangioma of skin and subcutaneous tissue: Secondary | ICD-10-CM | POA: Diagnosis not present

## 2019-05-24 DIAGNOSIS — D2271 Melanocytic nevi of right lower limb, including hip: Secondary | ICD-10-CM | POA: Diagnosis not present

## 2019-05-24 DIAGNOSIS — L814 Other melanin hyperpigmentation: Secondary | ICD-10-CM | POA: Diagnosis not present

## 2019-05-24 DIAGNOSIS — R69 Illness, unspecified: Secondary | ICD-10-CM | POA: Diagnosis not present

## 2019-05-24 DIAGNOSIS — Z85828 Personal history of other malignant neoplasm of skin: Secondary | ICD-10-CM | POA: Diagnosis not present

## 2019-05-24 DIAGNOSIS — D2272 Melanocytic nevi of left lower limb, including hip: Secondary | ICD-10-CM | POA: Diagnosis not present

## 2019-05-24 DIAGNOSIS — D225 Melanocytic nevi of trunk: Secondary | ICD-10-CM | POA: Diagnosis not present

## 2019-05-24 DIAGNOSIS — L570 Actinic keratosis: Secondary | ICD-10-CM | POA: Diagnosis not present

## 2019-07-12 ENCOUNTER — Encounter: Payer: Self-pay | Admitting: Family Medicine

## 2019-07-12 DIAGNOSIS — M5414 Radiculopathy, thoracic region: Secondary | ICD-10-CM | POA: Diagnosis not present

## 2019-07-12 DIAGNOSIS — M5416 Radiculopathy, lumbar region: Secondary | ICD-10-CM | POA: Diagnosis not present

## 2019-07-12 DIAGNOSIS — M9901 Segmental and somatic dysfunction of cervical region: Secondary | ICD-10-CM | POA: Diagnosis not present

## 2019-07-12 DIAGNOSIS — M9902 Segmental and somatic dysfunction of thoracic region: Secondary | ICD-10-CM | POA: Diagnosis not present

## 2019-07-12 DIAGNOSIS — M9903 Segmental and somatic dysfunction of lumbar region: Secondary | ICD-10-CM | POA: Diagnosis not present

## 2019-07-12 DIAGNOSIS — M531 Cervicobrachial syndrome: Secondary | ICD-10-CM | POA: Diagnosis not present

## 2019-07-25 ENCOUNTER — Other Ambulatory Visit: Payer: Self-pay | Admitting: Family

## 2019-07-26 ENCOUNTER — Encounter: Payer: Self-pay | Admitting: Family Medicine

## 2019-09-04 ENCOUNTER — Encounter: Payer: Self-pay | Admitting: Family Medicine

## 2019-09-05 ENCOUNTER — Other Ambulatory Visit: Payer: Self-pay | Admitting: Family Medicine

## 2019-09-05 DIAGNOSIS — M810 Age-related osteoporosis without current pathological fracture: Secondary | ICD-10-CM

## 2019-09-05 DIAGNOSIS — K219 Gastro-esophageal reflux disease without esophagitis: Secondary | ICD-10-CM

## 2019-09-05 DIAGNOSIS — E7800 Pure hypercholesterolemia, unspecified: Secondary | ICD-10-CM

## 2019-09-05 NOTE — Telephone Encounter (Signed)
Could you order labs if appropriate for patient? She is requesting to have labs when she is here tomorrow to see Glenard Haring for AWV. I will get patient on the lab schedule is pcp allows.

## 2019-09-05 NOTE — Progress Notes (Signed)
Subjective:   Jacqueline Ray is a 69 y.o. female who presents for Medicare Annual (Subsequent) preventive examination.  Review of Systems     Cardiac Risk Factors include: advanced age (>51men, >33 women);dyslipidemia;hypertension     Objective:    Today's Vitals   09/06/19 0818  BP: (!) 143/65  Pulse: 71  Temp: (!) 97 F (36.1 C)  TempSrc: Temporal  SpO2: 98%  Weight: 143 lb (64.9 kg)  Height: 5\' 2"  (1.575 m)   Body mass index is 26.16 kg/m.  Advanced Directives 09/06/2019 09/05/2018 04/05/2016  Does Patient Have a Medical Advance Directive? No No No  Would patient like information on creating a medical advance directive? No - Patient declined No - Patient declined No - Patient declined    Current Medications (verified) Outpatient Encounter Medications as of 09/06/2019  Medication Sig  . alendronate (FOSAMAX) 70 MG tablet TAKE 1 TABLET BY MOUTH ONCE WEEKLY ON AN EMPTY STOMACH BEFORE BREAKFAST. REMAIN UPRIGHT FOR 30 MINUTES & TAKE WITH 8 OUNCES OF WATER  . BIOTIN PO Take by mouth daily.  Marland Kitchen CALCIUM PO Take by mouth daily.  . Cholecalciferol (VITAMIN D PO) Take by mouth daily.  . fluticasone (FLONASE) 50 MCG/ACT nasal spray Place 2 sprays into both nostrils daily.  . Multiple Vitamins-Minerals (MULTIVITAMIN PO) Take by mouth daily.  . Omega-3 Fatty Acids (OMEGA 3 PO) Take by mouth daily.  Marland Kitchen VITAMIN E PO Take by mouth as needed.    No facility-administered encounter medications on file as of 09/06/2019.    Allergies (verified) Codeine and Darvocet [propoxyphene n-acetaminophen]   History: Past Medical History:  Diagnosis Date  . Abnormal Pap smear    as of 2014 it has been over 20 yrs ago   . Acid reflux 04/17/2017  . BCC (basal cell carcinoma of skin) 04/17/2017  . H/O measles   . High blood pressure 09/22/2018  . History of chicken pox   . Hyperlipidemia   . Inverted nipple   . Menorrhagia   . Osteoporosis 09/19/2018   Past Surgical History:  Procedure Laterality Date   . BASAL CELL CARCINOMA EXCISION     right posterior leg  . BREAST BIOPSY Right   . CRYOTHERAPY     for abn pap. as of 2014 it has been over 20 yrs ago since done  . DILATION AND CURETTAGE OF UTERUS     for menorrhagia  . FOOT SURGERY Left 2006   bone spur  . MOLE REMOVAL  2011   rt thigh, negative  . TUBAL LIGATION  1981  . TYMPANOSTOMY TUBE PLACEMENT     Family History  Problem Relation Age of Onset  . Osteoporosis Sister   . Heart disease Sister 44       pacemaker s/p MI  . Diabetes Sister   . Cancer Sister        liver  . Diabetes Brother   . Kidney disease Brother   . Cancer Brother        prostate  . Hypertension Mother   . Kidney disease Mother        dialysis, enlarged  . Heart disease Father   . Hypertension Father   . Cancer Paternal Grandmother    Social History   Socioeconomic History  . Marital status: Married    Spouse name: Not on file  . Number of children: Not on file  . Years of education: Not on file  . Highest education level: Not on file  Occupational  History  . Not on file  Tobacco Use  . Smoking status: Never Smoker  . Smokeless tobacco: Never Used  Vaping Use  . Vaping Use: Never used  Substance and Sexual Activity  . Alcohol use: Yes    Alcohol/week: 3.0 - 4.0 standard drinks    Types: 3 - 4 Glasses of wine per week    Comment: per week  . Drug use: No  . Sexual activity: Yes    Partners: Male    Birth control/protection: Surgical    Comment: BTL  Other Topics Concern  . Not on file  Social History Narrative   Works with [employer] in [place]. Lives with husband, no pets. No dietary restrictions, exercises regularly works out 3 x a week. Wears a seat belt.   Social Determinants of Health   Financial Resource Strain: Low Risk   . Difficulty of Paying Living Expenses: Not hard at all  Food Insecurity: No Food Insecurity  . Worried About Charity fundraiser in the Last Year: Never true  . Ran Out of Food in the Last Year:  Never true  Transportation Needs: No Transportation Needs  . Lack of Transportation (Medical): No  . Lack of Transportation (Non-Medical): No  Physical Activity:   . Days of Exercise per Week:   . Minutes of Exercise per Session:   Stress:   . Feeling of Stress :   Social Connections:   . Frequency of Communication with Friends and Family:   . Frequency of Social Gatherings with Friends and Family:   . Attends Religious Services:   . Active Member of Clubs or Organizations:   . Attends Archivist Meetings:   Marland Kitchen Marital Status:     Tobacco Counseling Counseling given: Not Answered   Clinical Intake: Pain : No/denies pain     Activities of Daily Living In your present state of health, do you have any difficulty performing the following activities: 09/06/2019  Hearing? N  Vision? N  Difficulty concentrating or making decisions? N  Walking or climbing stairs? N  Dressing or bathing? N  Doing errands, shopping? N  Preparing Food and eating ? N  Using the Toilet? N  In the past six months, have you accidently leaked urine? N  Do you have problems with loss of bowel control? N  Managing your Medications? N  Managing your Finances? N  Housekeeping or managing your Housekeeping? N  Some recent data might be hidden    Patient Care Team: Mosie Lukes, MD as PCP - General (Family Medicine)  Indicate any recent Medical Services you may have received from other than [facility] providers in the past year (date may be approximate).     Assessment:   This is a routine wellness examination for Jacqueline Ray.  Dietary issues and exercise activities discussed: Current Exercise Habits: Home exercise routine, Type of exercise: stretching;yoga;walking, Time (Minutes): 45, Frequency (Times/Week): 5, Weekly Exercise (Minutes/Week): 225, Intensity: Mild, Exercise limited by: None identified Diet (meal preparation, eat out, water intake, caffeinated beverages, dairy products, fruits and  vegetables): well balanced  Goals    . Maintain healthy active lifestyle.      Depression Screen PHQ 2/9 Scores 09/06/2019 09/05/2018  PHQ - 2 Score 0 0    Fall Risk Fall Risk  09/06/2019 09/05/2018  Falls in the past year? 0 0  Number falls in past yr: 0 -  Injury with Fall? 0 -  Follow up Education provided;Falls prevention discussed -  Lives w/ husband in 2 story home.  Any stairs in or around the home? Yes  If so, are there any without handrails? No  Home free of loose throw rugs in walkways, pet beds, electrical cords, etc? Yes  Adequate lighting in your home to reduce risk of falls? Yes    Gait steady and fast without use of assistive device  Cognitive Function: Ad8 score reviewed for issues:  Issues making decisions:no  Less interest in hobbies / activities:no  Repeats questions, stories (family complaining):no  Trouble using ordinary gadgets (microwave, computer, phone):no  Forgets the month or year: no  Mismanaging finances: no  Remembering appts:no  Daily problems with thinking and/or memory:no Ad8 score is=0        Immunizations Immunization History  Administered Date(s) Administered  . Fluad Quad(high Dose 65+) 11/18/2018  . Influenza, High Dose Seasonal PF 12/18/2017  . PFIZER SARS-COV-2 Vaccination 04/07/2019, 04/28/2019  . Pneumococcal Conjugate-13 04/14/2017  . Pneumococcal Polysaccharide-23 09/22/2018  . Tdap 12/31/2010    TDAP status: Up to date Flu Vaccine status: Up to date Pneumococcal vaccine status: Up to date Covid-19 vaccine status: Completed vaccines  Qualifies for Shingles Vaccine?   Shingrix Completed?: No.    Education has been provided regarding the importance of this vaccine. Patient has been advised to call insurance company to determine out of pocket expense if they have not yet received this vaccine. Advised may also receive vaccine at local pharmacy or Health Dept. Verbalized acceptance and understanding.  Screening  Tests Health Maintenance  Topic Date Due  . COLONOSCOPY  Never done  . INFLUENZA VACCINE  09/30/2019  . MAMMOGRAM  09/18/2020  . TETANUS/TDAP  12/30/2020  . DEXA SCAN  Completed  . COVID-19 Vaccine  Completed  . Hepatitis C Screening  Completed  . PNA vac Low Risk Adult  Completed    Health Maintenance  Health Maintenance Due  Topic Date Due  . COLONOSCOPY  Never done    Colon cancer screening: pt reports completed 2013 w/ 10 yr recall. Mammogram status: Completed 09/19/19. Repeat every year Bone Density status: Completed 09/19/18. Results reflect: Bone density results: OSTEOPOROSIS. Repeat every 2 years.  Lung Cancer Screening: (Low Dose CT Chest recommended if Age 50-80 years, 30 pack-year currently smoking OR have quit w/in 15years.) does not qualify.   Additional Screening:  Hepatitis C Screening: does qualify; Completed 04/04/15   Vision Screening: Recommended annual ophthalmology exams for early detection of glaucoma and other disorders of the eye. Is the patient up to date with their annual eye exam?  Yes  Who is the provider or what is the name of the office in which the patient attends annual eye exams? Dr.Perez at [facility]   Dental Screening: Recommended annual dental exams for proper oral hygiene  Community Resource Referral / Chronic Care Management: CRR required this visit?  No   CCM required this visit?  No      Plan:    Please schedule your next medicare wellness visit with me in 1 yr.  Continue to eat heart healthy diet (full of fruits, vegetables, whole grains, lean protein, water--limit salt, fat, and sugar intake) and increase physical activity as tolerated.  Continue doing brain stimulating activities (puzzles, reading, adult coloring books, staying active) to keep memory sharp.     I have personally reviewed and noted the following in the patient's chart:   . Medical and social history . Use of alcohol, tobacco or illicit drugs   . Current  medications and supplements . Functional ability and status . Nutritional status . Physical activity . Advanced directives . List of other physicians . Hospitalizations, surgeries, and ER visits in previous 12 months . Vitals . Screenings to include cognitive, depression, and falls . Referrals and appointments  In addition, I have reviewed and discussed with patient certain preventive protocols, quality metrics, and best practice recommendations. A written personalized care plan for preventive services as well as general preventive health recommendations were provided to patient.     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

## 2019-09-06 ENCOUNTER — Encounter: Payer: Self-pay | Admitting: *Deleted

## 2019-09-06 ENCOUNTER — Other Ambulatory Visit: Payer: Self-pay

## 2019-09-06 ENCOUNTER — Other Ambulatory Visit (INDEPENDENT_AMBULATORY_CARE_PROVIDER_SITE_OTHER): Payer: Medicare HMO

## 2019-09-06 ENCOUNTER — Ambulatory Visit (INDEPENDENT_AMBULATORY_CARE_PROVIDER_SITE_OTHER): Payer: Medicare HMO | Admitting: *Deleted

## 2019-09-06 VITALS — BP 143/65 | HR 71 | Temp 97.0°F | Ht 62.0 in | Wt 143.0 lb

## 2019-09-06 DIAGNOSIS — M9902 Segmental and somatic dysfunction of thoracic region: Secondary | ICD-10-CM | POA: Diagnosis not present

## 2019-09-06 DIAGNOSIS — M9901 Segmental and somatic dysfunction of cervical region: Secondary | ICD-10-CM | POA: Diagnosis not present

## 2019-09-06 DIAGNOSIS — Z0000 Encounter for general adult medical examination without abnormal findings: Secondary | ICD-10-CM

## 2019-09-06 DIAGNOSIS — M9903 Segmental and somatic dysfunction of lumbar region: Secondary | ICD-10-CM | POA: Diagnosis not present

## 2019-09-06 DIAGNOSIS — K219 Gastro-esophageal reflux disease without esophagitis: Secondary | ICD-10-CM | POA: Diagnosis not present

## 2019-09-06 DIAGNOSIS — E7800 Pure hypercholesterolemia, unspecified: Secondary | ICD-10-CM

## 2019-09-06 DIAGNOSIS — M810 Age-related osteoporosis without current pathological fracture: Secondary | ICD-10-CM | POA: Diagnosis not present

## 2019-09-06 DIAGNOSIS — M5416 Radiculopathy, lumbar region: Secondary | ICD-10-CM | POA: Diagnosis not present

## 2019-09-06 DIAGNOSIS — M531 Cervicobrachial syndrome: Secondary | ICD-10-CM | POA: Diagnosis not present

## 2019-09-06 DIAGNOSIS — M5414 Radiculopathy, thoracic region: Secondary | ICD-10-CM | POA: Diagnosis not present

## 2019-09-06 LAB — COMPREHENSIVE METABOLIC PANEL
ALT: 19 U/L (ref 0–35)
AST: 25 U/L (ref 0–37)
Albumin: 4.4 g/dL (ref 3.5–5.2)
Alkaline Phosphatase: 45 U/L (ref 39–117)
BUN: 13 mg/dL (ref 6–23)
CO2: 32 mEq/L (ref 19–32)
Calcium: 9.1 mg/dL (ref 8.4–10.5)
Chloride: 103 mEq/L (ref 96–112)
Creatinine, Ser: 0.9 mg/dL (ref 0.40–1.20)
GFR: 62.17 mL/min (ref >60.00)
Glucose, Bld: 93 mg/dL (ref 70–99)
Potassium: 4 mEq/L (ref 3.5–5.1)
Sodium: 141 mEq/L (ref 135–145)
Total Bilirubin: 0.5 mg/dL (ref 0.2–1.2)
Total Protein: 6.5 g/dL (ref 6.0–8.3)

## 2019-09-06 LAB — LIPID PANEL
Cholesterol: 242 mg/dL — ABNORMAL HIGH (ref 0–200)
HDL: 79.8 mg/dL (ref >39.00)
LDL Cholesterol: 147 mg/dL — ABNORMAL HIGH (ref 0–99)
NonHDL: 162.53
Total CHOL/HDL Ratio: 3
Triglycerides: 76 mg/dL (ref 0.0–149.0)
VLDL: 15.2 mg/dL (ref 0.0–40.0)

## 2019-09-06 LAB — CBC
HCT: 42.7 % (ref 36.0–46.0)
Hemoglobin: 14.4 g/dL (ref 12.0–15.0)
MCHC: 33.8 g/dL (ref 30.0–36.0)
MCV: 92.4 fl (ref 78.0–100.0)
Platelets: 218 10*3/uL (ref 150.0–400.0)
RBC: 4.62 Mil/uL (ref 3.87–5.11)
RDW: 13.6 % (ref 11.5–15.5)
WBC: 4.2 10*3/uL (ref 4.0–10.5)

## 2019-09-06 LAB — TSH: TSH: 2.04 u[IU]/mL (ref 0.35–4.50)

## 2019-09-06 LAB — VITAMIN D 25 HYDROXY (VIT D DEFICIENCY, FRACTURES): VITD: 35.7 ng/mL (ref 30.00–100.00)

## 2019-09-06 NOTE — Patient Instructions (Signed)
Jacqueline Ray , Thank you for taking time to come for your Medicare Wellness Visit. I appreciate your ongoing commitment to your health goals. Please review the following plan we discussed and let me know if I can assist you in the future.   Screening recommendations/referrals: Colon cancer screening: pt reports completed 2013 w/ 10 yr recall. Mammogram status: Completed 09/19/19. Repeat every year Bone Density status: Completed 09/19/18. Results reflect: Bone density results: OSTEOPOROSIS. Repeat every 2 years. Recommended yearly ophthalmology/optometry visit for glaucoma screening and checkup Recommended yearly dental visit for hygiene and checkup  Vaccinations: TDAP status: Up to date Flu Vaccine status: Up to date Pneumococcal vaccine status: Up to date Covid-19 vaccine status: Completed vaccines  Advanced directives: Bring a copy of your living will and/or healthcare power of attorney to your next office visit.   Next appointment: Follow up in one year for your annual wellness visit    Preventive Care 65 Years and Older, Female Preventive care refers to lifestyle choices and visits with your health care provider that can promote health and wellness. What does preventive care include?  A yearly physical exam. This is also called an annual well check.  Dental exams once or twice a year.  Routine eye exams. Ask your health care provider how often you should have your eyes checked.  Personal lifestyle choices, including:  Daily care of your teeth and gums.  Regular physical activity.  Eating a healthy diet.  Avoiding tobacco and drug use.  Limiting alcohol use.  Practicing safe sex.  Taking low-dose aspirin every day.  Taking vitamin and mineral supplements as recommended by your health care provider. What happens during an annual well check? The services and screenings done by your health care provider during your annual well check will depend on your age, overall  health, lifestyle risk factors, and family history of disease. Counseling  Your health care provider may ask you questions about your:  Alcohol use.  Tobacco use.  Drug use.  Emotional well-being.  Home and relationship well-being.  Sexual activity.  Eating habits.  History of falls.  Memory and ability to understand (cognition).  Work and work Statistician.  Reproductive health. Screening  You may have the following tests or measurements:  Height, weight, and BMI.  Blood pressure.  Lipid and cholesterol levels. These may be checked every 5 years, or more frequently if you are over 31 years old.  Skin check.  Lung cancer screening. You may have this screening every year starting at age 22 if you have a 30-pack-year history of smoking and currently smoke or have quit within the past 15 years.  Fecal occult blood test (FOBT) of the stool. You may have this test every year starting at age 35.  Flexible sigmoidoscopy or colonoscopy. You may have a sigmoidoscopy every 5 years or a colonoscopy every 10 years starting at age 64.  Hepatitis C blood test.  Hepatitis B blood test.  Sexually transmitted disease (STD) testing.  Diabetes screening. This is done by checking your blood sugar (glucose) after you have not eaten for a while (fasting). You may have this done every 1-3 years.  Bone density scan. This is done to screen for osteoporosis. You may have this done starting at age 54.  Mammogram. This may be done every 1-2 years. Talk to your health care provider about how often you should have regular mammograms. Talk with your health care provider about your test results, treatment options, and if necessary, the need for  more tests. Vaccines  Your health care provider may recommend certain vaccines, such as:  Influenza vaccine. This is recommended every year.  Tetanus, diphtheria, and acellular pertussis (Tdap, Td) vaccine. You may need a Td booster every 10  years.  Zoster vaccine. You may need this after age 34.  Pneumococcal 13-valent conjugate (PCV13) vaccine. One dose is recommended after age 1.  Pneumococcal polysaccharide (PPSV23) vaccine. One dose is recommended after age 11. Talk to your health care provider about which screenings and vaccines you need and how often you need them. This information is not intended to replace advice given to you by your health care provider. Make sure you discuss any questions you have with your health care provider. Document Released: 03/14/2015 Document Revised: 11/05/2015 Document Reviewed: 12/17/2014 Elsevier Interactive Patient Education  2017 [employer] Prevention in the Home Falls can cause injuries. They can happen to people of all ages. There are many things you can do to make your home safe and to help prevent falls. What can I do on the outside of my home?  Regularly fix the edges of walkways and driveways and fix any cracks.  Remove anything that might make you trip as you walk through a door, such as a raised step or threshold.  Trim any bushes or trees on the path to your home.  Use bright outdoor lighting.  Clear any walking paths of anything that might make someone trip, such as rocks or tools.  Regularly check to see if handrails are loose or broken. Make sure that both sides of any steps have handrails.  Any raised decks and porches should have guardrails on the edges.  Have any leaves, snow, or ice cleared regularly.  Use sand or salt on walking paths during winter.  Clean up any spills in your garage right away. This includes oil or grease spills. What can I do in the bathroom?  Use night lights.  Install grab bars by the toilet and in the tub and shower. Do not use towel bars as grab bars.  Use non-skid mats or decals in the tub or shower.  If you need to sit down in the shower, use a plastic, non-slip stool.  Keep the floor dry. Clean up any water that  spills on the floor as soon as it happens.  Remove soap buildup in the tub or shower regularly.  Attach bath mats securely with double-sided non-slip rug tape.  Do not have throw rugs and other things on the floor that can make you trip. What can I do in the bedroom?  Use night lights.  Make sure that you have a light by your bed that is easy to reach.  Do not use any sheets or blankets that are too big for your bed. They should not hang down onto the floor.  Have a firm chair that has side arms. You can use this for support while you get dressed.  Do not have throw rugs and other things on the floor that can make you trip. What can I do in the kitchen?  Clean up any spills right away.  Avoid walking on wet floors.  Keep items that you use a lot in easy-to-reach places.  If you need to reach something above you, use a strong step stool that has a grab bar.  Keep electrical cords out of the way.  Do not use floor polish or wax that makes floors slippery. If you must use wax, use non-skid  floor wax.  Do not have throw rugs and other things on the floor that can make you trip. What can I do with my stairs?  Do not leave any items on the stairs.  Make sure that there are handrails on both sides of the stairs and use them. Fix handrails that are broken or loose. Make sure that handrails are as long as the stairways.  Check any carpeting to make sure that it is firmly attached to the stairs. Fix any carpet that is loose or worn.  Avoid having throw rugs at the top or bottom of the stairs. If you do have throw rugs, attach them to the floor with carpet tape.  Make sure that you have a light switch at the top of the stairs and the bottom of the stairs. If you do not have them, ask someone to add them for you. What else can I do to help prevent falls?  Wear shoes that:  Do not have high heels.  Have rubber bottoms.  Are comfortable and fit you well.  Are closed at the  toe. Do not wear sandals.  If you use a stepladder:  Make sure that it is fully opened. Do not climb a closed stepladder.  Make sure that both sides of the stepladder are locked into place.  Ask someone to hold it for you, if possible.  Clearly mark and make sure that you can see:  Any grab bars or handrails.  First and last steps.  Where the edge of each step is.  Use tools that help you move around (mobility aids) if they are needed. These include:  Canes.  Walkers.  Scooters.  Crutches.  Turn on the lights when you go into a dark area. Replace any light bulbs as soon as they burn out.  Set up your furniture so you have a clear path. Avoid moving your furniture around.  If any of your floors are uneven, fix them.  If there are any pets around you, be aware of where they are.  Review your medicines with your doctor. Some medicines can make you feel dizzy. This can increase your chance of falling. Ask your doctor what other things that you can do to help prevent falls. This information is not intended to replace advice given to you by your health care provider. Make sure you discuss any questions you have with your health care provider. Document Released: 12/12/2008 Document Revised: 07/24/2015 Document Reviewed: 03/22/2014 Elsevier Interactive Patient Education  2017 [employer].

## 2019-09-12 ENCOUNTER — Ambulatory Visit (HOSPITAL_BASED_OUTPATIENT_CLINIC_OR_DEPARTMENT_OTHER)
Admission: RE | Admit: 2019-09-12 | Discharge: 2019-09-12 | Disposition: A | Discharge status: Home / Self Care | Payer: Medicare HMO | Source: Ambulatory Visit | Attending: Medical | Admitting: Medical

## 2019-09-12 ENCOUNTER — Ambulatory Visit (INDEPENDENT_AMBULATORY_CARE_PROVIDER_SITE_OTHER): Payer: Medicare HMO | Admitting: Medical

## 2019-09-12 ENCOUNTER — Other Ambulatory Visit: Payer: Self-pay

## 2019-09-12 VITALS — BP 132/80 | HR 77 | Resp 20 | Ht 63.0 in | Wt 144.8 lb

## 2019-09-12 DIAGNOSIS — M898X6 Other specified disorders of bone, lower leg: Secondary | ICD-10-CM

## 2019-09-12 DIAGNOSIS — R2241 Localized swelling, mass and lump, right lower limb: Secondary | ICD-10-CM | POA: Diagnosis not present

## 2019-09-12 NOTE — Progress Notes (Signed)
Subjective:    Patient ID: Jacqueline Ray, female    DOB: 12/08/1950, 69 y.o.   MRN: [id number]  HPI  Pt in for small bump middle to distal rt shin region bump. She noted this about 4 months. No known trauma. No bruising seen in that area. The area is tender. No fever, no chills or sweats.  No pain on walking only slight pain on palpation.      Review of Systems  Constitutional: Negative for chills, fatigue and fever.  Respiratory: Negative for cough, chest tightness, shortness of breath and wheezing.   Cardiovascular: Negative for chest pain and palpitations.  Gastrointestinal: Negative for abdominal pain.  Musculoskeletal:       See hpi.  Skin: Negative for color change, rash and wound.  Neurological: Negative for dizziness, seizures and weakness.  Hematological: Negative for adenopathy. Does not bruise/bleed easily.  Psychiatric/Behavioral: The patient is not nervous/anxious.    Past Medical History:  Diagnosis Date  . Abnormal Pap smear    as of 2014 it has been over 20 yrs ago   . Acid reflux 04/17/2017  . BCC (basal cell carcinoma of skin) 04/17/2017  . H/O measles   . High blood pressure 09/22/2018  . History of chicken pox   . Hyperlipidemia   . Inverted nipple   . Menorrhagia   . Osteoporosis 09/19/2018     Social History   Socioeconomic History  . Marital status: Married    Spouse name: Not on file  . Number of children: Not on file  . Years of education: Not on file  . Highest education level: Not on file  Occupational History  . Not on file  Tobacco Use  . Smoking status: Never Smoker  . Smokeless tobacco: Never Used  Vaping Use  . Vaping Use: Never used  Substance and Sexual Activity  . Alcohol use: Yes    Alcohol/week: 3.0 - 4.0 standard drinks    Types: 3 - 4 Glasses of wine per week    Comment: per week  . Drug use: No  . Sexual activity: Yes    Partners: Male    Birth control/protection: Surgical    Comment: BTL  Other Topics Concern  . Not  on file  Social History Narrative   Works with [employer] in [place]. Lives with husband, no pets. No dietary restrictions, exercises regularly works out 3 x a week. Wears a seat belt.   Social Determinants of Health   Financial Resource Strain: Low Risk   . Difficulty of Paying Living Expenses: Not hard at all  Food Insecurity: No Food Insecurity  . Worried About Charity fundraiser in the Last Year: Never true  . Ran Out of Food in the Last Year: Never true  Transportation Needs: No Transportation Needs  . Lack of Transportation (Medical): No  . Lack of Transportation (Non-Medical): No  Physical Activity:   . Days of Exercise per Week:   . Minutes of Exercise per Session:   Stress:   . Feeling of Stress :   Social Connections:   . Frequency of Communication with Friends and Family:   . Frequency of Social Gatherings with Friends and Family:   . Attends Religious Services:   . Active Member of Clubs or Organizations:   . Attends Archivist Meetings:   Marland Kitchen Marital Status:   Intimate Partner Violence:   . Fear of Current or Ex-Partner:   . Emotionally Abused:   Marland Kitchen Physically Abused:   .  Sexually Abused:     Past Surgical History:  Procedure Laterality Date  . BASAL CELL CARCINOMA EXCISION     right posterior leg  . BREAST BIOPSY Right   . CRYOTHERAPY     for abn pap. as of 2014 it has been over 20 yrs ago since done  . DILATION AND CURETTAGE OF UTERUS     for menorrhagia  . FOOT SURGERY Left 2006   bone spur  . MOLE REMOVAL  2011   rt thigh, negative  . TUBAL LIGATION  1981  . TYMPANOSTOMY TUBE PLACEMENT      Family History  Problem Relation Age of Onset  . Osteoporosis Sister   . Heart disease Sister 68       pacemaker s/p MI  . Diabetes Sister   . Cancer Sister        liver  . Diabetes Brother   . Kidney disease Brother   . Cancer Brother        prostate  . Hypertension Mother   . Kidney disease Mother        dialysis, enlarged  . Heart  disease Father   . Hypertension Father   . Cancer Paternal Grandmother     Allergies  Allergen Reactions  . Codeine   . Darvocet [Propoxyphene N-Acetaminophen] Nausea And Vomiting    Current Outpatient Medications on File Prior to Visit  Medication Sig Dispense Refill  . alendronate (FOSAMAX) 70 MG tablet TAKE 1 TABLET BY MOUTH ONCE WEEKLY ON AN EMPTY STOMACH BEFORE BREAKFAST. REMAIN UPRIGHT FOR 30 MINUTES & TAKE WITH 8 OUNCES OF WATER 4 tablet 5  . BIOTIN PO Take by mouth daily.    Marland Kitchen CALCIUM PO Take by mouth daily.    . Cholecalciferol (VITAMIN D PO) Take by mouth daily.    . fluticasone (FLONASE) 50 MCG/ACT nasal spray Place 2 sprays into both nostrils daily. 16 g 3  . Multiple Vitamins-Minerals (MULTIVITAMIN PO) Take by mouth daily.    . Omega-3 Fatty Acids (OMEGA 3 PO) Take by mouth daily.    Marland Kitchen VITAMIN E PO Take by mouth as needed.      No current facility-administered medications on file prior to visit.    BP 132/80 (BP Location: Left Arm, Patient Position: Sitting, Cuff Size: Large)   Pulse 77   Resp 20   Ht 5\' 3"  (1.6 m)   Wt 144 lb 12.8 oz (65.7 kg)   LMP 03/01/1997   SpO2 98%   BMI 25.65 kg/m       Objective:   Physical Exam  General- No acute distress. Pleasant patient. Neck- Full range of motion, no jvd Lungs- Clear, even and unlabored. Heart- regular rate and rhythm. Neurologic- CNII- XII grossly intact.   Rt lower ext- no calf swelling. Negative homans signs. Mid to lower slight swollen area overlying tibia. No redness, no warmth or lesions.      Assessment & Plan:  For your rt pretibial tender area will get xray to evaluate bone and get idea if soft tissue swelling. If any changes to area such as redness, warmth or further swelling let us know.  Will update you on xray. If xray does not reveal much then consider sports medicine MD referral. Sometimes [place] of area may be helpful. If referred sports med would decide if [place] indicated.  Follow up date to  be determined  Mackie Pai, PA-C   Time spent with patient today was20   minutes which consisted of chart  review, discussing diagnosis, work up, potential referral and documentation.

## 2019-09-12 NOTE — Patient Instructions (Signed)
For your rt pretibial tender area will get xray to evaluate bone and get idea if soft tissue swelling. If any changes to area such as redness, warmth or further swelling let us know.  Will update you on xray. If xray does not reveal much then consider sports medicine MD referral. Sometimes [place] of area may be helpful. If referred sports med would decide if [place] indicated.  Follow up date to be determined

## 2019-09-12 NOTE — Addendum Note (Signed)
Addended by: Anabel Halon on: 09/12/2019 05:25 PM   Modules accepted: Orders

## 2019-09-24 ENCOUNTER — Ambulatory Visit: Payer: Medicare HMO | Admitting: Family Medicine

## 2019-09-24 ENCOUNTER — Other Ambulatory Visit: Payer: Self-pay

## 2019-09-24 ENCOUNTER — Ambulatory Visit: Payer: Self-pay

## 2019-09-24 ENCOUNTER — Encounter: Payer: Self-pay | Admitting: Family Medicine

## 2019-09-24 VITALS — BP 139/93 | HR 81 | Ht 64.0 in | Wt 140.0 lb

## 2019-09-24 DIAGNOSIS — M898X6 Other specified disorders of bone, lower leg: Secondary | ICD-10-CM

## 2019-09-24 DIAGNOSIS — M79604 Pain in right leg: Secondary | ICD-10-CM

## 2019-09-24 NOTE — Progress Notes (Signed)
Jacqueline Ray - 69 y.o. female MRN [id number]  Date of birth: [birth year]  SUBJECTIVE:  Including CC & ROS.  Chief Complaint  Patient presents with  . Leg Pain    right leg    Jacqueline Ray is a 69 y.o. female that is presenting with bilateral shin pain.  Has been ongoing for about 5 months.  Denies any specific inciting event.  No cat bite or insect bites.  Does not remember an inciting event.  Has a history of a dog bite from 1992 on the right anterior shin.  Does not have pain with walking around.  No history of stress fractures.  Started Fosamax last year for osteoporosis..  Independent review of the right tibia and fibula from 7/14 shows no acute bony abnormality.   Review of Systems See HPI   HISTORY: Past Medical, Surgical, Social, and Family History Reviewed & Updated per EMR.   Pertinent Historical Findings include:  Past Medical History:  Diagnosis Date  . Abnormal Pap smear    as of 2014 it has been over 20 yrs ago   . Acid reflux 04/17/2017  . BCC (basal cell carcinoma of skin) 04/17/2017  . H/O measles   . High blood pressure 09/22/2018  . History of chicken pox   . Hyperlipidemia   . Inverted nipple   . Menorrhagia   . Osteoporosis 09/19/2018    Past Surgical History:  Procedure Laterality Date  . BASAL CELL CARCINOMA EXCISION     right posterior leg  . BREAST BIOPSY Right   . CRYOTHERAPY     for abn pap. as of 2014 it has been over 20 yrs ago since done  . DILATION AND CURETTAGE OF UTERUS     for menorrhagia  . FOOT SURGERY Left 2006   bone spur  . MOLE REMOVAL  2011   rt thigh, negative  . TUBAL LIGATION  1981  . TYMPANOSTOMY TUBE PLACEMENT      Family History  Problem Relation Age of Onset  . Osteoporosis Sister   . Heart disease Sister 2       pacemaker s/p MI  . Diabetes Sister   . Cancer Sister        liver  . Diabetes Brother   . Kidney disease Brother   . Cancer Brother        prostate  . Hypertension Mother   . Kidney disease Mother         dialysis, enlarged  . Heart disease Father   . Hypertension Father   . Cancer Paternal Grandmother     Social History   Socioeconomic History  . Marital status: Married    Spouse name: Not on file  . Number of children: Not on file  . Years of education: Not on file  . Highest education level: Not on file  Occupational History  . Not on file  Tobacco Use  . Smoking status: Never Smoker  . Smokeless tobacco: Never Used  Vaping Use  . Vaping Use: Never used  Substance and Sexual Activity  . Alcohol use: Yes    Alcohol/week: 3.0 - 4.0 standard drinks    Types: 3 - 4 Glasses of wine per week    Comment: per week  . Drug use: No  . Sexual activity: Yes    Partners: Male    Birth control/protection: Surgical    Comment: BTL  Other Topics Concern  . Not on file  Social History Narrative   Works with  [employer] in [place]. Lives with husband, no pets. No dietary restrictions, exercises regularly works out 3 x a week. Wears a seat belt.   Social Determinants of Health   Financial Resource Strain: Low Risk   . Difficulty of Paying Living Expenses: Not hard at all  Food Insecurity: No Food Insecurity  . Worried About Charity fundraiser in the Last Year: Never true  . Ran Out of Food in the Last Year: Never true  Transportation Needs: No Transportation Needs  . Lack of Transportation (Medical): No  . Lack of Transportation (Non-Medical): No  Physical Activity:   . Days of Exercise per Week:   . Minutes of Exercise per Session:   Stress:   . Feeling of Stress :   Social Connections:   . Frequency of Communication with Friends and Family:   . Frequency of Social Gatherings with Friends and Family:   . Attends Religious Services:   . Active Member of Clubs or Organizations:   . Attends Archivist Meetings:   Marland Kitchen Marital Status:   Intimate Partner Violence:   . Fear of Current or Ex-Partner:   . Emotionally Abused:   Marland Kitchen Physically Abused:   . Sexually Abused:       PHYSICAL EXAM:  VS: BP (!) 139/93   Pulse 81   Ht 5\' 4"  (1.626 m)   Wt 140 lb (63.5 kg)   LMP 03/01/1997   BMI 24.03 kg/m  Physical Exam Gen: NAD, alert, cooperative with exam, well-appearing MSK:  Right and left leg: No overlying ecchymosis or swelling. Area of tenderness in the distal tibia on the right and left. Normal ankle range of motion. Normal plantar flexion dorsiflexion. Normal knee range of motion. Neurovascularly intact  Limited ultrasound: Right and left tibia:  Right tibia: No changes of the tibial cortex.  There appears to be heterogenous change of the subcutaneous over the area of tenderness.  There is increased hyperemia in the area of heterogeneity.  Left tibia: Appears similar to the right.  No changes of the cortex of the tibia.  Has heterogeneity of the subcutaneous.  Increased hyperemia in this area.  Summary: Nonspecific findings of the right and left tibia.  It appears to be subcutaneous in nature.  Ultrasound and interpretation by Clearance Coots, MD    ASSESSMENT & PLAN:   Bilateral tibial pain Nonspecific findings on ultrasound.  There is nonspecific changes of the subcutaneous with hyperemia.  Does not appear to be bony related.  Unclear if there is a work-up that she does that irritates this area.  Not likely for infectious etiology. -Counseled on supportive care. -Counseled to monitor the area note for any change. -Follow-up in 2 months to rescan the area.

## 2019-09-24 NOTE — Assessment & Plan Note (Signed)
Nonspecific findings on ultrasound.  There is nonspecific changes of the subcutaneous with hyperemia.  Does not appear to be bony related.  Unclear if there is a work-up that she does that irritates this area.  Not likely for infectious etiology. -Counseled on supportive care. -Counseled to monitor the area note for any change. -Follow-up in 2 months to rescan the area.

## 2019-09-24 NOTE — Patient Instructions (Signed)
Nice to meet you Please monitor the area and let me know if it changes  Please try voltaren cream on it.   Please send me a message in MyChart with any questions or updates.  Please see me back in 2 months .   --Dr. Raeford Razor

## 2019-11-01 DIAGNOSIS — M9901 Segmental and somatic dysfunction of cervical region: Secondary | ICD-10-CM | POA: Diagnosis not present

## 2019-11-01 DIAGNOSIS — M531 Cervicobrachial syndrome: Secondary | ICD-10-CM | POA: Diagnosis not present

## 2019-11-01 DIAGNOSIS — M5416 Radiculopathy, lumbar region: Secondary | ICD-10-CM | POA: Diagnosis not present

## 2019-11-01 DIAGNOSIS — M5414 Radiculopathy, thoracic region: Secondary | ICD-10-CM | POA: Diagnosis not present

## 2019-11-01 DIAGNOSIS — M9903 Segmental and somatic dysfunction of lumbar region: Secondary | ICD-10-CM | POA: Diagnosis not present

## 2019-11-01 DIAGNOSIS — M9902 Segmental and somatic dysfunction of thoracic region: Secondary | ICD-10-CM | POA: Diagnosis not present

## 2019-11-23 ENCOUNTER — Other Ambulatory Visit: Payer: Self-pay

## 2019-11-23 ENCOUNTER — Ambulatory Visit (INDEPENDENT_AMBULATORY_CARE_PROVIDER_SITE_OTHER): Payer: Medicare HMO | Admitting: *Deleted

## 2019-11-23 DIAGNOSIS — Z23 Encounter for immunization: Secondary | ICD-10-CM

## 2019-11-23 NOTE — Progress Notes (Signed)
Patient here for high dose flu vaccine.  Vaccine given in left deltoid and patient tolerated well.  

## 2019-11-29 DIAGNOSIS — R69 Illness, unspecified: Secondary | ICD-10-CM | POA: Diagnosis not present

## 2019-12-05 ENCOUNTER — Other Ambulatory Visit (HOSPITAL_BASED_OUTPATIENT_CLINIC_OR_DEPARTMENT_OTHER): Payer: Self-pay | Admitting: Family Medicine

## 2019-12-05 DIAGNOSIS — Z1231 Encounter for screening mammogram for malignant neoplasm of breast: Secondary | ICD-10-CM

## 2020-01-03 DIAGNOSIS — M5416 Radiculopathy, lumbar region: Secondary | ICD-10-CM | POA: Diagnosis not present

## 2020-01-03 DIAGNOSIS — M531 Cervicobrachial syndrome: Secondary | ICD-10-CM | POA: Diagnosis not present

## 2020-01-03 DIAGNOSIS — M5414 Radiculopathy, thoracic region: Secondary | ICD-10-CM | POA: Diagnosis not present

## 2020-01-03 DIAGNOSIS — M9903 Segmental and somatic dysfunction of lumbar region: Secondary | ICD-10-CM | POA: Diagnosis not present

## 2020-01-03 DIAGNOSIS — M9902 Segmental and somatic dysfunction of thoracic region: Secondary | ICD-10-CM | POA: Diagnosis not present

## 2020-01-03 DIAGNOSIS — M9901 Segmental and somatic dysfunction of cervical region: Secondary | ICD-10-CM | POA: Diagnosis not present

## 2020-01-08 ENCOUNTER — Encounter (HOSPITAL_BASED_OUTPATIENT_CLINIC_OR_DEPARTMENT_OTHER): Payer: Self-pay

## 2020-01-08 ENCOUNTER — Inpatient Hospital Stay (HOSPITAL_BASED_OUTPATIENT_CLINIC_OR_DEPARTMENT_OTHER): Admission: RE | Admit: 2020-01-08 | Payer: Medicare HMO | Source: Ambulatory Visit

## 2020-01-17 ENCOUNTER — Other Ambulatory Visit: Payer: Self-pay | Admitting: Family Medicine

## 2020-01-17 ENCOUNTER — Encounter: Payer: Self-pay | Admitting: Family Medicine

## 2020-05-02 ENCOUNTER — Telehealth: Payer: Self-pay | Admitting: Family Medicine

## 2020-05-02 NOTE — Telephone Encounter (Signed)
LVM AWV appt change to 09/11/20 w/Martha Stanley.  Please verify appt change.

## 2020-05-13 ENCOUNTER — Encounter: Payer: Self-pay | Admitting: Family Medicine

## 2020-05-13 ENCOUNTER — Other Ambulatory Visit: Payer: Self-pay | Admitting: Family Medicine

## 2020-05-13 MED ORDER — ALENDRONATE SODIUM 70 MG PO TABS: 70.0000 mg | ORAL_TABLET | ORAL | 1 refills | Status: AC

## 2020-08-21 ENCOUNTER — Telehealth: Payer: Self-pay | Admitting: Family Medicine

## 2020-08-21 NOTE — Telephone Encounter (Signed)
I do not see anything scanned for 07/14/2017.

## 2020-08-21 NOTE — Telephone Encounter (Signed)
Patient called she would like her document that was scan on  [id number]/162019 be fax to her new PCP Osie Cheeks office  fax # is [phone]  Please advice

## 2020-09-08 ENCOUNTER — Ambulatory Visit: Payer: Medicare HMO | Admitting: *Deleted

## 2020-09-11 ENCOUNTER — Ambulatory Visit: Payer: Medicare HMO

## 2020-11-03 ENCOUNTER — Other Ambulatory Visit: Payer: Self-pay | Admitting: Family Medicine

## 2022-06-14 ENCOUNTER — Encounter: Payer: Self-pay | Admitting: *Deleted
